# Patient Record
Sex: Female | Born: 1985 | Race: Black or African American | Hispanic: No | Smoking: Former smoker
Health system: Southern US, Community
[De-identification: ages and names within clinical notes are randomized; demographics above are authoritative.]

## PROBLEM LIST (undated history)

## (undated) DIAGNOSIS — N83209 Unspecified ovarian cyst, unspecified side: Secondary | ICD-10-CM

## (undated) DIAGNOSIS — Z720 Tobacco use: Secondary | ICD-10-CM

## (undated) DIAGNOSIS — D259 Leiomyoma of uterus, unspecified: Secondary | ICD-10-CM

## (undated) DIAGNOSIS — J84116 Cryptogenic organizing pneumonia: Secondary | ICD-10-CM

## (undated) DIAGNOSIS — J45909 Unspecified asthma, uncomplicated: Secondary | ICD-10-CM

## (undated) DIAGNOSIS — D649 Anemia, unspecified: Secondary | ICD-10-CM

## (undated) DIAGNOSIS — G43909 Migraine, unspecified, not intractable, without status migrainosus: Secondary | ICD-10-CM

## (undated) HISTORY — DX: Anemia, unspecified: D64.9

## (undated) HISTORY — PX: TUBAL LIGATION: SHX77

---
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
## 2010-07-04 ENCOUNTER — Emergency Department (HOSPITAL_COMMUNITY)
Admission: EM | Admit: 2010-07-04 | Discharge: 2010-07-05 | Disposition: A | Discharge status: Home / Self Care | Payer: Self-pay | Attending: Emergency Medicine | Admitting: Emergency Medicine

## 2010-07-04 DIAGNOSIS — R55 Syncope and collapse: Secondary | ICD-10-CM | POA: Insufficient documentation

## 2010-07-04 DIAGNOSIS — N898 Other specified noninflammatory disorders of vagina: Secondary | ICD-10-CM | POA: Insufficient documentation

## 2010-07-04 DIAGNOSIS — R10814 Left lower quadrant abdominal tenderness: Secondary | ICD-10-CM | POA: Insufficient documentation

## 2010-07-04 DIAGNOSIS — R51 Headache: Secondary | ICD-10-CM | POA: Insufficient documentation

## 2010-07-04 DIAGNOSIS — N739 Female pelvic inflammatory disease, unspecified: Secondary | ICD-10-CM | POA: Insufficient documentation

## 2010-07-04 DIAGNOSIS — H538 Other visual disturbances: Secondary | ICD-10-CM | POA: Insufficient documentation

## 2010-07-04 DIAGNOSIS — R1032 Left lower quadrant pain: Secondary | ICD-10-CM | POA: Insufficient documentation

## 2010-07-04 DIAGNOSIS — N949 Unspecified condition associated with female genital organs and menstrual cycle: Secondary | ICD-10-CM | POA: Insufficient documentation

## 2010-07-04 DIAGNOSIS — D72829 Elevated white blood cell count, unspecified: Secondary | ICD-10-CM | POA: Insufficient documentation

## 2010-07-04 DIAGNOSIS — R002 Palpitations: Secondary | ICD-10-CM | POA: Insufficient documentation

## 2010-07-04 DIAGNOSIS — D649 Anemia, unspecified: Secondary | ICD-10-CM | POA: Insufficient documentation

## 2010-07-04 LAB — BASIC METABOLIC PANEL
Calcium: 9.3 mg/dL (ref 8.4–10.5)
GFR calc Af Amer: >60 mL/min (ref >60)
GFR calc non Af Amer: >60 mL/min (ref >60)
Sodium: 135 mEq/L (ref 135–145)

## 2010-07-04 LAB — DIFFERENTIAL
Basophils Absolute: 0 10*3/uL (ref 0.0–0.1)
Lymphocytes Relative: 21 % (ref 12–46)
Lymphs Abs: 2.4 10*3/uL (ref 0.7–4.0)
Monocytes Absolute: 0.5 10*3/uL (ref 0.1–1.0)
Neutro Abs: 8 10*3/uL — ABNORMAL HIGH (ref 1.7–7.7)

## 2010-07-04 LAB — URINALYSIS, ROUTINE W REFLEX MICROSCOPIC
Nitrite: NEGATIVE
Protein, ur: NEGATIVE mg/dL
Urobilinogen, UA: 1 mg/dL (ref 0.0–1.0)

## 2010-07-04 LAB — CBC
HCT: 34.9 % — ABNORMAL LOW (ref 36.0–46.0)
Hemoglobin: 11.5 g/dL — ABNORMAL LOW (ref 12.0–15.0)
MCHC: 33 g/dL (ref 30.0–36.0)
MCV: 95.6 fL (ref 78.0–100.0)
WBC: 11.1 10*3/uL — ABNORMAL HIGH (ref 4.0–10.5)

## 2010-07-04 LAB — POCT CARDIAC MARKERS
CKMB, poc: <1 ng/mL — ABNORMAL LOW (ref 1.0–8.0)
Myoglobin, poc: 33 ng/mL (ref 12–200)

## 2010-07-04 LAB — URINE MICROSCOPIC-ADD ON

## 2010-07-05 ENCOUNTER — Emergency Department (HOSPITAL_COMMUNITY): Payer: Self-pay

## 2010-07-05 LAB — WET PREP, GENITAL

## 2010-07-07 LAB — GC/CHLAMYDIA PROBE AMP, GENITAL: GC Probe Amp, Genital: NEGATIVE

## 2011-11-14 ENCOUNTER — Emergency Department (HOSPITAL_COMMUNITY)
Admission: EM | Admit: 2011-11-14 | Discharge: 2011-11-14 | Disposition: A | Discharge status: Home / Self Care | Payer: Self-pay | Attending: Emergency Medicine | Admitting: Emergency Medicine

## 2011-11-14 ENCOUNTER — Encounter (HOSPITAL_COMMUNITY): Payer: Self-pay | Admitting: Emergency Medicine

## 2011-11-14 DIAGNOSIS — J329 Chronic sinusitis, unspecified: Secondary | ICD-10-CM | POA: Insufficient documentation

## 2011-11-14 DIAGNOSIS — J45909 Unspecified asthma, uncomplicated: Secondary | ICD-10-CM | POA: Insufficient documentation

## 2011-11-14 DIAGNOSIS — F17200 Nicotine dependence, unspecified, uncomplicated: Secondary | ICD-10-CM | POA: Insufficient documentation

## 2011-11-14 HISTORY — DX: Unspecified asthma, uncomplicated: J45.909

## 2011-11-14 HISTORY — DX: Migraine, unspecified, not intractable, without status migrainosus: G43.909

## 2011-11-14 MED ORDER — AMOXICILLIN 500 MG PO CAPS
1000.0000 mg | ORAL_CAPSULE | Freq: Once | ORAL | Status: AC
  Administered 2011-11-14: 1000 mg via ORAL
  Filled 2011-11-14: qty 2

## 2011-11-14 MED ORDER — OXYCODONE-ACETAMINOPHEN 5-325 MG PO TABS: 1.0000 | ORAL_TABLET | ORAL | Status: AC | PRN

## 2011-11-14 MED ORDER — AMOXICILLIN 500 MG PO CAPS: 1000.0000 mg | ORAL_CAPSULE | Freq: Two times a day (BID) | ORAL | Status: AC

## 2011-11-14 MED ORDER — HYDROCODONE-ACETAMINOPHEN 5-325 MG PO TABS
2.0000 | ORAL_TABLET | Freq: Once | ORAL | Status: AC
  Administered 2011-11-14: 2 via ORAL
  Filled 2011-11-14: qty 2

## 2011-11-14 MED ORDER — OXYMETAZOLINE HCL 0.05 % NA SOLN
1.0000 | Freq: Once | NASAL | Status: AC
  Administered 2011-11-14: 1 via NASAL
  Filled 2011-11-14: qty 15

## 2011-11-14 NOTE — ED Notes (Signed)
Reports has sinus infection---reports R side of face hurts and headache--reports to R ear, R teeth to top of R forehead pain; reports coughed up green phlegm today and having congestion

## 2011-11-14 NOTE — ED Provider Notes (Signed)
History   This chart was scribed for Hilario Quarry, MD by Melba Coon. The patient was seen in room TR08C/TR08C and the patient's care was started at 9:20PM.    CSN: [id number]  Arrival date & time 11/14/11  2011   First MD Initiated Contact with Patient 11/14/11 2113      Chief Complaint  Patient presents with  . Sinusitis    (Consider location/radiation/quality/duration/timing/severity/associated sxs/prior treatment) The history is provided by the patient. No language interpreter was used.   Madison Copeland is a 26 y.o. female who presents to the Emergency Department complaining of persistent, moderate to severe superior headache with associated productive hemoptysis mixed with green phlegm with an onset yesterday. Hx of sinus infections in the past. Pt states she is spitting up blood and feels like she has a fever. Ibuprofen and oral sinus decongestants did not alleviate the symptoms. Nosebleed with green mucous today. Neck pain present. No HA, fever, neck pain, sore throat, rash, back pain, CP, SOB, abd pain, n/v/d, dysuria, or extremity pain, edema, weakness, numbness, or tingling. Pt is not pregnant (tubal ligation). No known allergies. No other pertinent medical symptoms.  Past Medical History  Diagnosis Date  . Asthma   . Migraine     Past Surgical History  Procedure Date  . Cesarean section   Tubal ligation  History reviewed. No pertinent family history.  History  Substance Use Topics  . Smoking status: Current Everyday Smoker -- 0.2 packs/day  . Smokeless tobacco: Not on file  . Alcohol Use: Yes     occasion    OB History    Grav Para Term Preterm Abortions TAB SAB Ect Mult Living                  Review of Systems 10 Systems reviewed and all are negative for acute change except as noted in the HPI.   Allergies  Review of patient's allergies indicates no known allergies.  Home Medications   Current Outpatient Rx  Name Route Sig Dispense Refill  .  IBUPROFEN 200 MG PO TABS Oral Take 200 mg by mouth every 6 (six) hours as needed. For pain      BP 138/91  Pulse 74  Temp 98.6 F (37 C) (Oral)  Resp 14  SpO2 99%  Physical Exam  Nursing note and vitals reviewed. Constitutional: She is oriented to person, place, and time. She appears well-developed and well-nourished. No distress.  HENT:  Head: Normocephalic and atraumatic.       Maxillary sinus tenderness  Eyes: EOM are normal.  Neck: Neck supple. No tracheal deviation present.  Cardiovascular: Normal rate, regular rhythm and normal heart sounds.   No murmur heard. Pulmonary/Chest: Effort normal and breath sounds normal. No respiratory distress. She has no wheezes.  Musculoskeletal: Normal range of motion. She exhibits no edema and no tenderness.  Neurological: She is alert and oriented to person, place, and time.  Skin: Skin is warm and dry. No rash noted.  Psychiatric: She has a normal mood and affect. Her behavior is normal.    ED Course  Procedures (including critical care time)  DIAGNOSTIC STUDIES: Oxygen Saturation is 99% on room air, normal by my interpretation.    COORDINATION OF CARE:  9:23PM - amoxicillin and Afrin will be Rx for the pt. Pt ready for d/c.   Labs Reviewed - No data to display No results found.   No diagnosis found.    MDM  History/physical exam/procedure(s) were performed by  non-physician practitioner and as supervising physician I was immediately available for consultation/collaboration. I have reviewed all notes and am in agreement with care and plan.         Hilario Quarry, MD 11/14/11 2232

## 2012-07-04 ENCOUNTER — Emergency Department (HOSPITAL_COMMUNITY): Payer: Self-pay

## 2012-07-04 ENCOUNTER — Emergency Department (HOSPITAL_COMMUNITY)
Admission: EM | Admit: 2012-07-04 | Discharge: 2012-07-04 | Disposition: A | Discharge status: Home / Self Care | Payer: Self-pay | Attending: Emergency Medicine | Admitting: Emergency Medicine

## 2012-07-04 ENCOUNTER — Encounter (HOSPITAL_COMMUNITY): Payer: Self-pay | Admitting: *Deleted

## 2012-07-04 DIAGNOSIS — D259 Leiomyoma of uterus, unspecified: Secondary | ICD-10-CM

## 2012-07-04 DIAGNOSIS — J45909 Unspecified asthma, uncomplicated: Secondary | ICD-10-CM | POA: Insufficient documentation

## 2012-07-04 DIAGNOSIS — B9689 Other specified bacterial agents as the cause of diseases classified elsewhere: Secondary | ICD-10-CM

## 2012-07-04 DIAGNOSIS — N898 Other specified noninflammatory disorders of vagina: Secondary | ICD-10-CM | POA: Insufficient documentation

## 2012-07-04 DIAGNOSIS — Z3202 Encounter for pregnancy test, result negative: Secondary | ICD-10-CM | POA: Insufficient documentation

## 2012-07-04 DIAGNOSIS — R1032 Left lower quadrant pain: Secondary | ICD-10-CM

## 2012-07-04 DIAGNOSIS — Z8679 Personal history of other diseases of the circulatory system: Secondary | ICD-10-CM | POA: Insufficient documentation

## 2012-07-04 DIAGNOSIS — F17200 Nicotine dependence, unspecified, uncomplicated: Secondary | ICD-10-CM | POA: Insufficient documentation

## 2012-07-04 DIAGNOSIS — N760 Acute vaginitis: Secondary | ICD-10-CM | POA: Insufficient documentation

## 2012-07-04 DIAGNOSIS — A499 Bacterial infection, unspecified: Secondary | ICD-10-CM | POA: Insufficient documentation

## 2012-07-04 LAB — URINALYSIS, ROUTINE W REFLEX MICROSCOPIC
Hgb urine dipstick: NEGATIVE
Leukocytes, UA: NEGATIVE
Protein, ur: NEGATIVE mg/dL
Urobilinogen, UA: 1 mg/dL (ref 0.0–1.0)

## 2012-07-04 LAB — POCT PREGNANCY, URINE: Preg Test, Ur: NEGATIVE

## 2012-07-04 LAB — WET PREP, GENITAL: Trich, Wet Prep: NONE SEEN

## 2012-07-04 MED ORDER — HYDROCODONE-ACETAMINOPHEN 5-325 MG PO TABS
1.0000 | ORAL_TABLET | Freq: Once | ORAL | Status: AC
  Administered 2012-07-04: 1 via ORAL
  Filled 2012-07-04: qty 1

## 2012-07-04 MED ORDER — NAPROXEN 500 MG PO TABS: 500.0000 mg | ORAL_TABLET | Freq: Two times a day (BID) | ORAL | Status: DC

## 2012-07-04 MED ORDER — METRONIDAZOLE 500 MG PO TABS: 500.0000 mg | ORAL_TABLET | Freq: Two times a day (BID) | ORAL | Status: DC

## 2012-07-04 NOTE — ED Notes (Signed)
Patient returned from Ultrasound. 

## 2012-07-04 NOTE — ED Notes (Signed)
Pt reports sharp pelvic pain over last few days. Denies bleeding or d/c. L sided pelvic/lower abd pain. +nausea. Reports irregular periods for last few months.

## 2012-07-04 NOTE — ED Provider Notes (Signed)
History     CSN: [id number]  Arrival date & time 07/04/12  1030   First MD Initiated Contact with Patient 07/04/12 1035      Chief Complaint  Patient presents with  . Pelvic Pain    (Consider location/radiation/quality/duration/timing/severity/associated sxs/prior treatment) HPI Comments: 27 year old smoker presents emergency department complaining of left lower quadrant abdominal pain.  Onset of symptoms began in the last week and are described as a sharp stabbing sensation.  Pain radiates from left flank to groin area and back.  Patient reports that she suffers from menorrhagia typically and has had pain like this before, however she is not currently on her period. Patient's last menstrual period was 06/07/2012.  She is currently sexually active and does not use protection, patient states that she had her tubes tied.  She denies any nausea, vomiting, vaginal discharge, dysuria, hematuria, fevers, night sweats, chills, appetite change.  No other complaints at this time.    The history is provided by the patient.    Past Medical History  Diagnosis Date  . Asthma   . Migraine     Past Surgical History  Procedure Laterality Date  . Cesarean section      History reviewed. No pertinent family history.  History  Substance Use Topics  . Smoking status: Current Every Day Smoker -- 0.25 packs/day  . Smokeless tobacco: Never Used  . Alcohol Use: Yes     Comment: occasion    OB History   Grav Para Term Preterm Abortions TAB SAB Ect Mult Living                  Review of Systems  All other systems reviewed and are negative.    Allergies  Review of patient's allergies indicates no known allergies.  Home Medications   Current Outpatient Rx  Name  Route  Sig  Dispense  Refill  . ibuprofen (ADVIL,MOTRIN) 200 MG tablet   Oral   Take 200 mg by mouth every 6 (six) hours as needed. For pain           BP 130/79  Pulse 90  Temp(Src) 98.5 F (36.9 C) (Oral)  Resp 16   Wt 115 lb (52.164 kg)  SpO2 100%  LMP 06/07/2012  Physical Exam  Nursing note and vitals reviewed. Constitutional: She is oriented to person, place, and time. She appears well-developed and well-nourished. No distress.  HENT:  Head: Normocephalic and atraumatic.  Eyes: Conjunctivae and EOM are normal.  Neck: Normal range of motion.  Cardiovascular:  RRR, intact distal pulses   Pulmonary/Chest: Effort normal.  LCAB   Abdominal:  Abdomen soft, LLQ abdominal ttp. No peritoneal signs.   Genitourinary:  No CVA tenderness.  Exam performed by Jaci Carrel,  exam chaperoned Date: 07/04/2012 Pelvic exam: normal external genitalia without evidence of trauma. VAGINA: normal appearing vagina with normal color and discharge, no lesions. CERVIX: normal appearing cervix without lesions, cervical motion tenderness absent, cervical os closed with out purulent discharge; vaginal discharge - clear and white, Wet prep and DNA probe for chlamydia and GC obtained.  ADNEXA: left sided ttp, no masses    Musculoskeletal: Normal range of motion.  Neurological: She is alert and oriented to person, place, and time.  Skin: Skin is warm and dry. No rash noted. She is not diaphoretic.  Psychiatric: She has a normal mood and affect. Her behavior is normal.    ED Course  Procedures (including critical care time)  Labs Reviewed  WET PREP, GENITAL -  Abnormal; Notable for the following:    Clue Cells Wet Prep HPF POC MODERATE (*)    WBC, Wet Prep HPF POC FEW (*)    All other components within normal limits  GC/CHLAMYDIA PROBE AMP  URINALYSIS, ROUTINE W REFLEX MICROSCOPIC  POCT PREGNANCY, URINE   US Transvaginal Non-ob  07/04/2012  *RADIOLOGY REPORT*  Clinical Data: Pelvic pain  TRANSABDOMINAL ULTRASOUND OF PELVIS  Technique:  Transabdominal ultrasound examination of the pelvis was performed including evaluation of the uterus, ovaries, adnexal regions, and pelvic cul-de-sac.  Comparison:  07/05/2010   Findings:  Uterus:  10.2 x 4.2 x 5.6 cm.  C-section scar is evident.  The small sub centimeter anterior intramural fibroid noted.  Endometrium: 12 mm in double layer thickness.  Right ovary: 2 x 1.8 x 2.8 cm.  Sonographically normal.  Left ovary: 3.6 x 1.2 x 3.0 cm.  Sonographically normal.  Other Findings:  No free fluid  IMPRESSION: A small intramural anterior fibroid.  Otherwise normal.   Original Report Authenticated By: Kennith Center, M.D.    US Pelvis Complete  07/04/2012  *RADIOLOGY REPORT*  Clinical Data: Pelvic pain  TRANSABDOMINAL ULTRASOUND OF PELVIS  Technique:  Transabdominal ultrasound examination of the pelvis was performed including evaluation of the uterus, ovaries, adnexal regions, and pelvic cul-de-sac.  Comparison:  07/05/2010  Findings:  Uterus:  10.2 x 4.2 x 5.6 cm.  C-section scar is evident.  The small sub centimeter anterior intramural fibroid noted.  Endometrium: 12 mm in double layer thickness.  Right ovary: 2 x 1.8 x 2.8 cm.  Sonographically normal.  Left ovary: 3.6 x 1.2 x 3.0 cm.  Sonographically normal.  Other Findings:  No free fluid  IMPRESSION: A small intramural anterior fibroid.  Otherwise normal.   Original Report Authenticated By: Kennith Center, M.D.      No diagnosis found.     MDM  Patient to be discharged with instructions to follow up with OBGYN.  Pt understands that they have GC/Chlamydia cultures pending and that they will need to inform all sexual partners if results return positive. Pt not concerning for PID because hemodynamically stable and no cervical motion tenderness on pelvic exam. Pt has also been treated with flagyl for Bacterial Vaginosis. Pt has been advised to not drink alcohol while on this medication. Small fibroid seen [place].          Jaci Carrel, [place] 07/04/12 1449

## 2012-07-04 NOTE — ED Provider Notes (Signed)
Medical screening examination/treatment/procedure(s) were performed by non-physician practitioner and as supervising physician I was immediately available for consultation/collaboration.  Gilda Crease, MD 07/04/12 1455

## 2012-09-27 ENCOUNTER — Emergency Department (HOSPITAL_COMMUNITY): Payer: BC Managed Care – PPO

## 2012-09-27 ENCOUNTER — Encounter (HOSPITAL_COMMUNITY): Payer: Self-pay | Admitting: Emergency Medicine

## 2012-09-27 ENCOUNTER — Emergency Department (HOSPITAL_COMMUNITY)
Admission: EM | Admit: 2012-09-27 | Discharge: 2012-09-27 | Disposition: A | Discharge status: Home / Self Care | Payer: BC Managed Care – PPO | Attending: Emergency Medicine | Admitting: Emergency Medicine

## 2012-09-27 DIAGNOSIS — R110 Nausea: Secondary | ICD-10-CM | POA: Insufficient documentation

## 2012-09-27 DIAGNOSIS — Z8742 Personal history of other diseases of the female genital tract: Secondary | ICD-10-CM | POA: Insufficient documentation

## 2012-09-27 DIAGNOSIS — F17200 Nicotine dependence, unspecified, uncomplicated: Secondary | ICD-10-CM | POA: Insufficient documentation

## 2012-09-27 DIAGNOSIS — Z3202 Encounter for pregnancy test, result negative: Secondary | ICD-10-CM | POA: Insufficient documentation

## 2012-09-27 DIAGNOSIS — R1032 Left lower quadrant pain: Secondary | ICD-10-CM

## 2012-09-27 DIAGNOSIS — Z8679 Personal history of other diseases of the circulatory system: Secondary | ICD-10-CM | POA: Insufficient documentation

## 2012-09-27 DIAGNOSIS — Z79899 Other long term (current) drug therapy: Secondary | ICD-10-CM | POA: Insufficient documentation

## 2012-09-27 DIAGNOSIS — J45909 Unspecified asthma, uncomplicated: Secondary | ICD-10-CM | POA: Insufficient documentation

## 2012-09-27 DIAGNOSIS — N83209 Unspecified ovarian cyst, unspecified side: Secondary | ICD-10-CM | POA: Insufficient documentation

## 2012-09-27 DIAGNOSIS — N920 Excessive and frequent menstruation with regular cycle: Secondary | ICD-10-CM | POA: Insufficient documentation

## 2012-09-27 DIAGNOSIS — N390 Urinary tract infection, site not specified: Secondary | ICD-10-CM

## 2012-09-27 HISTORY — DX: Tobacco use: Z72.0

## 2012-09-27 HISTORY — DX: Leiomyoma of uterus, unspecified: D25.9

## 2012-09-27 LAB — URINALYSIS, ROUTINE W REFLEX MICROSCOPIC
Bilirubin Urine: NEGATIVE
Ketones, ur: NEGATIVE mg/dL
Nitrite: POSITIVE — AB
Specific Gravity, Urine: 1.026 (ref 1.005–1.030)
Urobilinogen, UA: 1 mg/dL (ref 0.0–1.0)

## 2012-09-27 LAB — CBC WITH DIFFERENTIAL/PLATELET
Eosinophils Absolute: 0.3 10*3/uL (ref 0.0–0.7)
Eosinophils Relative: 3 % (ref 0–5)
HCT: 36.5 % (ref 36.0–46.0)
Hemoglobin: 12.1 g/dL (ref 12.0–15.0)
Lymphs Abs: 1.4 10*3/uL (ref 0.7–4.0)
MCH: 31.6 pg (ref 26.0–34.0)
MCV: 95.3 fL (ref 78.0–100.0)
Monocytes Absolute: 0.4 10*3/uL (ref 0.1–1.0)
Monocytes Relative: 4 % (ref 3–12)
Neutrophils Relative %: 80 % — ABNORMAL HIGH (ref 43–77)
RBC: 3.83 MIL/uL — ABNORMAL LOW (ref 3.87–5.11)

## 2012-09-27 LAB — COMPREHENSIVE METABOLIC PANEL
ALT: 9 U/L (ref 0–35)
Alkaline Phosphatase: 48 U/L (ref 39–117)
BUN: 10 mg/dL (ref 6–23)
CO2: 24 mEq/L (ref 19–32)
Calcium: 9.5 mg/dL (ref 8.4–10.5)
GFR calc Af Amer: >90 mL/min (ref >90)
GFR calc non Af Amer: >90 mL/min (ref >90)
Glucose, Bld: 90 mg/dL (ref 70–99)
Sodium: 136 mEq/L (ref 135–145)

## 2012-09-27 LAB — WET PREP, GENITAL
Trich, Wet Prep: NONE SEEN
Yeast Wet Prep HPF POC: NONE SEEN

## 2012-09-27 MED ORDER — ONDANSETRON 4 MG PO TBDP: 4.0000 mg | ORAL_TABLET | Freq: Once | ORAL | Status: DC

## 2012-09-27 MED ORDER — ONDANSETRON 4 MG PO TBDP
4.0000 mg | ORAL_TABLET | Freq: Once | ORAL | Status: AC
  Administered 2012-09-27: 4 mg via ORAL
  Filled 2012-09-27: qty 1

## 2012-09-27 MED ORDER — MORPHINE SULFATE 4 MG/ML IJ SOLN
4.0000 mg | Freq: Once | INTRAMUSCULAR | Status: AC
  Administered 2012-09-27: 4 mg via INTRAVENOUS
  Filled 2012-09-27: qty 1

## 2012-09-27 MED ORDER — CLINDAMYCIN HCL 300 MG PO CAPS: 450.0000 mg | ORAL_CAPSULE | Freq: Once | ORAL | Status: DC

## 2012-09-27 MED ORDER — MORPHINE SULFATE 2 MG/ML IJ SOLN: 2.0000 mg | Freq: Once | INTRAMUSCULAR | Status: DC

## 2012-09-27 MED ORDER — IBUPROFEN 800 MG PO TABS: 800.0000 mg | ORAL_TABLET | Freq: Three times a day (TID) | ORAL | Status: DC | PRN

## 2012-09-27 MED ORDER — CEPHALEXIN 500 MG PO CAPS: 500.0000 mg | ORAL_CAPSULE | Freq: Three times a day (TID) | ORAL | Status: DC

## 2012-09-27 MED ORDER — MORPHINE SULFATE 2 MG/ML IJ SOLN
2.0000 mg | Freq: Once | INTRAMUSCULAR | Status: DC
  Filled 2012-09-27: qty 1

## 2012-09-27 NOTE — ED Notes (Signed)
Patient was educated not to drive, operate heavy machinery, or drink alcohol while taking narcotic medication.  

## 2012-09-27 NOTE — ED Notes (Signed)
MD at bedside. 

## 2012-09-27 NOTE — ED Provider Notes (Signed)
History    CSN: [id number] Arrival date & time 09/27/12  1436 First MD Initiated Contact with Patient 09/27/12 1515     Chief Complaint  Patient presents with  . Abdominal Pain   HPI  27 year old smoker with history of abdominal pain presents with an acute flare of intermittent 6 month abdominal pain.   Pain started 6 months ago. Not always associated with periods. Pain comes on twice a month and lasts for a few days. At times associated with nausea as it is today. No emesis.  Pain up to 10/10 at most severe. Located LLQ some radiation upwards to LUQ at times (slightly today). Pain up to 10/10 earlier today down to 6/10 with approximately. No constipation/diarrhea. Denies fever/chills/CVA tenderness. Mild suprapubic discomfort as well. LMP 09/24/12-currently on period and has menorrhagia.   Seen 07/04/12 and diagnosed with BV and found to have fibroid (sub cm intramural) on ultrasound. Seen in 2012 for abdominal pain as well (patient did not report pain that far back) and had small functional cyst.   Past Medical History  Diagnosis Date  . Asthma   . Migraine   . Fibroid, uterine     small intramural  . Tobacco abuse   advised to quit smoking  Past Surgical History  Procedure Laterality Date  . Cesarean section    . Tubal ligation     Family History  Problem Relation Age of Onset  . Diabetes Mother   . Heart disease Mother    History  Substance Use Topics  . Smoking status: Current Every Day Smoker -- 0.25 packs/day  . Smokeless tobacco: Never Used  . Alcohol Use: Yes     Comment: occasion   Review of Systems A full 10 point review of symptoms was performed and was negative except as noted in HPI.   Allergies  Review of patient's allergies indicates no known allergies.  Home Medications   Current Outpatient Rx  Name  Route  Sig  Dispense  Refill  . albuterol (PROVENTIL HFA;VENTOLIN HFA) 108 (90 BASE) MCG/ACT inhaler   Inhalation   Inhale 2 puffs into the lungs  every 6 (six) hours as needed for wheezing or shortness of breath.         Marland Kitchen ibuprofen (ADVIL,MOTRIN) 200 MG tablet   Oral   Take 800-1,200 mg by mouth every 6 (six) hours as needed for pain. For pain          BP 138/83  Pulse 76  Temp(Src) 98.8 F (37.1 C) (Oral)  Resp 16  SpO2 100% Physical Exam  Constitutional: She is oriented to person, place, and time. She appears well-developed and well-nourished. No distress.  HENT:  Head: Normocephalic and atraumatic.  Eyes: EOM are normal. Pupils are equal, round, and reactive to light.  Neck: Normal range of motion. Neck supple.  Cardiovascular: Normal rate and regular rhythm.  Exam reveals no gallop and no friction rub.   No murmur heard. Pulmonary/Chest: Effort normal and breath sounds normal. She has no wheezes. She has no rales.  Abdominal: Soft. Bowel sounds are normal. She exhibits no distension and no mass. There is tenderness (LLQ moderate). There is no rebound and no guarding.  Musculoskeletal: Normal range of motion. She exhibits no edema.  No CVA tenderness  Neurological: She is alert and oriented to person, place, and time. No cranial nerve deficit. She exhibits normal muscle tone. Coordination normal.  Skin: Skin is warm and dry.  Psychiatric: She has a normal  mood and affect. Her behavior is normal.  Physical Exam Pelvic exam: normal external genitalia, vulva, vagina, cervix, uterus and adnexa, VAGINA: normal appearing vagina with normal color and discharge, no lesions, CERVIX: normal appearing cervix without discharge or lesions, currently menstruating, UTERUS: uterus is normal size, shape, consistency and nontender, ADNEXA: normal adnexa in size, nontender and no masses, No CMT.   ED Course  Procedures (including critical care time) Labs Reviewed  WET PREP, GENITAL - Abnormal; Notable for the following:    Clue Cells Wet Prep HPF POC RARE (*)    WBC, Wet Prep HPF POC FEW (*)    All other components within normal  limits  CBC WITH DIFFERENTIAL - Abnormal; Notable for the following:    RBC 3.83 (*)    Neutrophils Relative % 80 (*)    Neutro Abs 8.2 (*)    All other components within normal limits  URINALYSIS, ROUTINE W REFLEX MICROSCOPIC - Abnormal; Notable for the following:    APPearance CLOUDY (*)    Hgb urine dipstick SMALL (*)    Nitrite POSITIVE (*)    Leukocytes, UA SMALL (*)    All other components within normal limits  URINE MICROSCOPIC-ADD ON - Abnormal; Notable for the following:    Squamous Epithelial / LPF MANY (*)    Bacteria, UA MANY (*)    All other components within normal limits  URINE CULTURE  GC/CHLAMYDIA PROBE AMP  LIPASE, BLOOD  COMPREHENSIVE METABOLIC PANEL  POCT PREGNANCY, URINE   Ct Abdomen Pelvis Wo Contrast  09/27/2012   *RADIOLOGY REPORT*  Clinical Data: Left flank and left lower quadrant abdominal pain  CT ABDOMEN AND PELVIS WITHOUT CONTRAST  Technique:  Multidetector CT imaging of the abdomen and pelvis was performed following the standard protocol without intravenous contrast.  Comparison: Pelvic ultrasound 07/04/2012  Findings:  Lower Chest:  Lung bases are clear.  Visualized heart structures within normal limits for size.  No pericardial effusion. Unremarkable distal thoracic esophagus.  Abdomen: Unenhanced CT was performed per clinician order.  Lack of IV contrast limits sensitivity and specificity, especially for evaluation of abdominal/pelvic solid viscera.  Unremarkable CT appearance of the stomach, duodenum, spleen, adrenal glands and pancreas.  Normal hepatic contours.  No focal hepatic lesion. Gallbladder is unremarkable. No intra or extrahepatic biliary ductal dilatation.  Symmetric renal size bilaterally.  No hydronephrosis no nephrolithiasis.  Normal-caliber large and small bowel throughout the abdomen.  No evidence of obstruction.  No significant colonic diverticular disease. Normal appendix.  Pelvis: There is a 3.4 cm low attenuation cystic structure arising  from the right adnexa.  Unremarkable bladder and uterus.  Tampon within the vagina.  Small amount of free fluid is likely physiologic. The left ovary is not well seen.  Bones: No acute fracture or aggressive appearing lytic or blastic osseous lesion.  Vascular: No significant vascular abnormality.  IMPRESSION:  1.  3.4 cm right ovarian cyst.  This is almost certainly benign, and no specific imaging follow up is recommended according to the Society of Radiologists in Ultrasound 2010 Consensus  Conference Statement (D Lenis Noon et al. Management of Asymptomatic Ovarian and Other Adnexal Cysts Imaged at [place]:  Society of Radiologists in Ultrasound Consensus Conference Statement 2010.  Radiology 256 (Sept 2010): 943-954.). 2.  Normal appendix. 2.  No nephro or ureterolithiasis.   Original Report Authenticated By: Malachy Moan, M.D.   1. UTI (lower urinary tract infection)   2. LLQ abdominal pain    MDM  Recurrent LLQ abdominal pain  x 6 months with acute flare. Flare similar to previous when seen in ED in April with ultrasound at that time showing small intramural fibroid only. CT abdomen to rule out nephrolithiasis and only finding was incidental right sided ovarian cyst which does nto require follow up. . Incidental ovarian cyst noted which is not likely cause of pain. UTI noted-will treat with Keflex (doubt this would cause level of pain). CBC, CMP, wet prep (rare clue with no reported discharge), lipase unremarkable. Upreg negative. No CMT so doubt PID. Gc/chlamydia pending.   Will send patient to OB/gyn as previously encouraged and GI for recurrent abdominal pain. Ibuprofen for pain.  Advised smoking cessation  Shelva Majestic, MD 09/27/12 2010

## 2012-09-27 NOTE — ED Provider Notes (Signed)
I saw and evaluated the patient, reviewed the resident's note and I agree with the findings and plan.  Patient does have a urinary tract infection.  CT scan without acute pathology.  Home with antibiotics  Lyanne Co, MD 09/27/12 [phone]

## 2012-09-27 NOTE — ED Notes (Signed)
States that she began having abd pain this am. States that she has not had vomiting or diarrhea with this pain. States that she abd pain periodically.

## 2012-09-28 LAB — GC/CHLAMYDIA PROBE AMP
CT Probe RNA: NEGATIVE
GC Probe RNA: NEGATIVE

## 2012-09-29 LAB — URINE CULTURE

## 2012-09-30 ENCOUNTER — Telehealth (HOSPITAL_COMMUNITY): Payer: Self-pay | Admitting: Emergency Medicine

## 2012-09-30 NOTE — ED Notes (Signed)
Post ED Visit - Positive Culture Follow-up  Culture report reviewed by antimicrobial stewardship pharmacist: []  Wes Dulaney, Pharm.D., BCPS []  Celedonio Miyamoto, Pharm.D., BCPS []  Georgina Pillion, Pharm.D., BCPS []  [place], [address].D., BCPS, AAHIVP []  Estella Husk, Pharm.D., BCPS, AAHIVP [x]  Okey Regal, [address].D., BCPS  Positive urine culture Treated with Keflex, organism sensitive to the same and no further patient follow-up is required at this time.  Kylie A Holland 09/30/2012, 6:06 PM

## 2012-10-01 ENCOUNTER — Encounter: Payer: Self-pay | Admitting: Gastroenterology

## 2012-10-03 ENCOUNTER — Ambulatory Visit (INDEPENDENT_AMBULATORY_CARE_PROVIDER_SITE_OTHER): Payer: BC Managed Care – PPO | Admitting: Obstetrics & Gynecology

## 2012-10-03 ENCOUNTER — Encounter: Payer: Self-pay | Admitting: Obstetrics & Gynecology

## 2012-10-03 VITALS — BP 140/92 | HR 62 | Temp 98.6°F | Ht 61.0 in | Wt 118.7 lb

## 2012-10-03 DIAGNOSIS — N946 Dysmenorrhea, unspecified: Secondary | ICD-10-CM

## 2012-10-03 MED ORDER — NORELGESTROMIN-ETH ESTRADIOL 150-35 MCG/24HR TD PTWK: 1.0000 | MEDICATED_PATCH | TRANSDERMAL | Status: DC

## 2012-10-03 NOTE — Patient Instructions (Signed)
Dysmenorrhea  Menstrual pain is caused by the muscles of the uterus tightening (contracting) during a menstrual period. The muscles of the uterus contract due to the chemicals in the uterine lining.  Primary dysmenorrhea is menstrual cramps that last a couple of days when you start having menstrual periods or soon after. This often begins after a teenager starts having her period. As a woman gets older or has a baby, the cramps will usually lesson or disappear.  Secondary dysmenorrhea begins later in life, lasts longer, and the pain may be stronger than primary dysmenorrhea. The pain may start before the period and last a few days after the period. This type of dysmenorrhea is usually caused by an underlying problem such as:   The tissue lining the uterus grows outside of the uterus in other areas of the body (endometriosis).   The endometrial tissue, which normally lines the uterus, is found in or grows into the muscular walls of the uterus (adenomyosis).   The pelvic blood vessels are engorged with blood just before the menstrual period (pelvic congestive syndrome).   Overgrowth of cells in the lining of the uterus or cervix (polyps of the uterus or cervix).   Falling down of the uterus (prolapse) because of loose or stretched ligaments.   Depression.   Bladder problems, infection, or inflammation.   Problems with the intestine, a tumor, or irritable bowel syndrome.   Cancer of the female organs or bladder.   A severely tipped uterus.   A very tight opening or closed cervix.   Noncancerous tumors of the uterus (fibroids).   Pelvic inflammatory disease (PID).   Pelvic scarring (adhesions) from a previous surgery.   Ovarian cyst.   An intrauterine device (IUD) used for birth control.  CAUSES   The cause of menstrual pain is often unknown.  SYMPTOMS    Cramping or throbbing pain in your lower abdomen.   Sometimes, a woman may also experience headaches.   Lower back pain.   Feeling sick to your  stomach (nausea) or vomiting.   Diarrhea.   Sweating or dizziness.  DIAGNOSIS   A diagnosis is based on your history, symptoms, physical examination, diagnostic tests, or procedures. Diagnostic tests or procedures may include:   Blood tests.   An ultrasound.   An examination of the lining of the uterus (dilation and curettage, D&C).   An examination inside your abdomen or pelvis with a scope (laparoscopy).   X-rays.   CT Scan.   MRI.   An examination inside the bladder with a scope (cystoscopy).   An examination inside the intestine or stomach with a scope (colonoscopy, gastroscopy).  TREATMENT   Treatment depends on the cause of the dysmenorrhea. Treatment may include:   Pain medicine prescribed by your caregiver.   Birth control pills.   Hormone replacement therapy.   Nonsteroidal anti-inflammatory drugs (NSAIDs). These may help stop the production of prostaglandins.   An IUD with progesterone hormone in it.   Acupuncture.   Surgery to remove adhesions, endometriosis, ovarian cyst, or fibroids.   Removal of the uterus (hysterectomy).   Progesterone shots to stop the menstrual period.   Cutting the nerves on the sacrum that go to the female organs (presacral neurectomy).   Electric currant to the sacral nerves (sacral nerve stimulation).   Antidepressant medicine.   Psychiatric therapy, counseling, or group therapy.   Exercise and physical therapy.   Meditation and yoga therapy.  HOME CARE INSTRUCTIONS    Only take over-the-counter   or prescription medicines for pain, discomfort, or fever as directed by your caregiver.   Place a heating pad or hot water bottle on your lower back or abdomen. Do not sleep with the heating pad.   Use aerobic exercises, walking, swimming, biking, and other exercises to help lessen the cramping.   Massage to the lower back or abdomen may help.   Stop smoking.   Avoid alcohol and caffeine.   Yoga, meditation, or acupuncture may help.  SEEK MEDICAL CARE IF:     The pain does not get better with medicine.   You have pain with sexual intercourse.  SEEK IMMEDIATE MEDICAL CARE IF:    Your pain increases and is not controlled with medicines.   You have a fever.   You develop nausea or vomiting with your period not controlled with medicine.   You have abnormal vaginal bleeding with your period.   You pass out.  MAKE SURE YOU:    Understand these instructions.   Will watch your condition.   Will get help right away if you are not doing well or get worse.  Document Released: 03/21/2005 Document Revised: 06/13/2011 Document Reviewed: 07/07/2008  [employer] Patient Information 2014 [employer]

## 2012-10-03 NOTE — Progress Notes (Signed)
Patient ID: Madison Copeland, female   DOB: Apr 18, 1985, 27 y.o.   MRN: [id number]  Chief Complaint  Patient presents with  . Referral    WLED for fibroids    HPI Madison Copeland is a 27 y.o. female.  [id number] Patient's last menstrual period was 09/27/2012. Heavy period, regular, last for 8 days, with pain. Had tubes tied 3 years ago.  HPI  Past Medical History  Diagnosis Date  . Asthma   . Migraine   . Fibroid, uterine     small intramural  . Tobacco abuse     Past Surgical History  Procedure Laterality Date  . Cesarean section    . Tubal ligation      Family History  Problem Relation Age of Onset  . Diabetes Mother   . Heart disease Mother     Social History History  Substance Use Topics  . Smoking status: Current Every Day Smoker -- 0.25 packs/day  . Smokeless tobacco: Never Used  . Alcohol Use: Yes     Comment: occasion    No Known Allergies  Current Outpatient Prescriptions  Medication Sig Dispense Refill  . albuterol (PROVENTIL HFA;VENTOLIN HFA) 108 (90 BASE) MCG/ACT inhaler Inhale 2 puffs into the lungs every 6 (six) hours as needed for wheezing or shortness of breath.      . cephALEXin (KEFLEX) 500 MG capsule Take 1 capsule (500 mg total) by mouth 3 (three) times daily. For UTI.  21 capsule  0  . ibuprofen (ADVIL,MOTRIN) 800 MG tablet Take 1 tablet (800 mg total) by mouth every 8 (eight) hours as needed for pain.  30 tablet  0  . norelgestromin-ethinyl estradiol (ORTHO EVRA) 150-35 MCG/24HR transdermal patch Place 1 patch onto the skin once a week.  3 patch  12  . ondansetron (ZOFRAN-ODT) 4 MG disintegrating tablet Take 1 tablet (4 mg total) by mouth once.  20 tablet  0   No current facility-administered medications for this visit.    Review of Systems Review of Systems  Blood pressure 140/92, pulse 62, temperature 98.6 F (37 C), temperature source Oral, height 5\' 1"  (1.549 m), weight 118 lb 11.2 oz (53.842 kg), last menstrual period 09/27/2012.  Physical  Exam Physical Exam  Data Reviewed *RADIOLOGY REPORT*  Clinical Data: Pelvic pain  TRANSABDOMINAL ULTRASOUND OF PELVIS  Technique: Transabdominal ultrasound examination of the pelvis was  performed including evaluation of the uterus, ovaries, adnexal  regions, and pelvic cul-de-sac.  Comparison: 07/05/2010  Findings:  Uterus: 10.2 x 4.2 x 5.6 cm. C-section scar is evident. The  small sub centimeter anterior intramural fibroid noted.  Endometrium: 12 mm in double layer thickness.  Right ovary: 2 x 1.8 x 2.8 cm. Sonographically normal.  Left ovary: 3.6 x 1.2 x 3.0 cm. Sonographically normal.  Other Findings: No free fluid  IMPRESSION:  A small intramural anterior fibroid. Otherwise normal.  Original Report Authenticated By: Kennith Center, M.D.    Assessment    Menorrhagia and dysmenorrhea   Miniscule fibroid H/O  Smoker-encouraged to quit Plan    Ortho Evra patch, RTC 3 months        Roswell Ndiaye 10/03/2012, 4:32 PM

## 2012-10-17 ENCOUNTER — Ambulatory Visit (INDEPENDENT_AMBULATORY_CARE_PROVIDER_SITE_OTHER): Payer: BC Managed Care – PPO | Admitting: Gastroenterology

## 2012-10-17 ENCOUNTER — Encounter: Payer: Self-pay | Admitting: Gastroenterology

## 2012-10-17 VITALS — BP 134/86 | HR 88 | Ht 61.0 in | Wt 117.1 lb

## 2012-10-17 DIAGNOSIS — K5900 Constipation, unspecified: Secondary | ICD-10-CM

## 2012-10-17 DIAGNOSIS — R109 Unspecified abdominal pain: Secondary | ICD-10-CM

## 2012-10-17 MED ORDER — HYOSCYAMINE SULFATE 0.125 MG SL SUBL: SUBLINGUAL_TABLET | SUBLINGUAL | Status: DC

## 2012-10-17 NOTE — Progress Notes (Signed)
History of Present Illness: This is a 27 year old female with a six-month history of recurrent left-sided abdominal pain occurring every few weeks. She has constipation and nausea. She has had episodes of vomiting and relates that her nausea and vomiting are related to certain smells and are associated with her pain. She notes frequent bloating as well. She generally has a bowel movement about every 3-4 days. She does not necessarily correlate with timing of bowel movements with any increase in other symptoms. She underwent abdominal/pelvic CT scan in late June showing a right ovarian cyst, and was otherwise unremarkable. Blood work performed in late June was unremarkable. Denies weight loss, diarrhea, change in stool caliber, melena, hematochezia, dysphagia, reflux symptoms, chest pain.  Review of Systems: Pertinent positive and negative review of systems were noted in the above HPI section. All other review of systems were otherwise negative.  Current Medications, Allergies, Past Medical History, Past Surgical History, Family History and Social History were reviewed in [employer] record.  Physical Exam: General: Well developed , well nourished, no acute distress Head: Normocephalic and atraumatic Eyes:  sclerae anicteric, EOMI Ears: Normal auditory acuity Mouth: No deformity or lesions Neck: Supple, no masses or thyromegaly Lungs: Clear throughout to auscultation Heart: Regular rate and rhythm; no murmurs, rubs or bruits Abdomen: Soft, mild generalized tenderness and non distended. No masses, hepatosplenomegaly or hernias noted. Normal Bowel sounds Musculoskeletal: Symmetrical with no gross deformities  Skin: No lesions on visible extremities Pulses:  Normal pulses noted Extremities: No clubbing, cyanosis, edema or deformities noted Neurological: Alert oriented x 4, grossly nonfocal Cervical Nodes:  No significant cervical adenopathy Inguinal Nodes: No significant  inguinal adenopathy Psychological:  Alert and cooperative. Normal mood and affect  Assessment and Recommendations:  1. Intermittent left-sided abdominal pain with constipation, bloating and intermittent nausea and vomiting. Presumed constipation related symptoms. Obtain stool Hemoccults. Begin MiraLax 1-3 times daily titrated for a bowel movement every day to every other day. Hyoscyamine when necessary. Return office visit in one month. Consider upper endoscopy and colonoscopy if symptoms are not substantially improved.

## 2012-10-17 NOTE — Patient Instructions (Addendum)
We have sent the following medications to your pharmacy for you to pick up at your convenience:  Take Miralax over the counter mixing 17 grams in at least 8 oz of water 1-2 x daily for constipation.  Follow instructions on Hemoccult cards and mail them back to [place] when finished.  Thank you for choosing me and [facility].  Venita Lick. Pleas Koch., MD., Clementeen Graham

## 2012-11-02 ENCOUNTER — Other Ambulatory Visit: Payer: Self-pay | Admitting: Gastroenterology

## 2012-11-02 DIAGNOSIS — K5900 Constipation, unspecified: Secondary | ICD-10-CM

## 2012-11-02 DIAGNOSIS — R109 Unspecified abdominal pain: Secondary | ICD-10-CM

## 2012-11-21 ENCOUNTER — Ambulatory Visit: Payer: BC Managed Care – PPO | Admitting: Gastroenterology

## 2012-12-10 ENCOUNTER — Encounter: Payer: Self-pay | Admitting: Gastroenterology

## 2012-12-10 ENCOUNTER — Ambulatory Visit (INDEPENDENT_AMBULATORY_CARE_PROVIDER_SITE_OTHER): Payer: BC Managed Care – PPO | Admitting: Gastroenterology

## 2012-12-10 VITALS — BP 132/94 | HR 84 | Ht 61.0 in | Wt 114.0 lb

## 2012-12-10 DIAGNOSIS — K5900 Constipation, unspecified: Secondary | ICD-10-CM

## 2012-12-10 DIAGNOSIS — R1032 Left lower quadrant pain: Secondary | ICD-10-CM

## 2012-12-10 MED ORDER — HYOSCYAMINE SULFATE ER 0.375 MG PO TB12: 0.3750 mg | ORAL_TABLET | Freq: Two times a day (BID) | ORAL | Status: DC | PRN

## 2012-12-10 NOTE — Progress Notes (Signed)
History of Present Illness: This is a 27 year old female who returns for followup of left lower quadrant abdominal pain and constipation. Her symptoms improved substantially with the use of hyoscyamine however she would like medication that lasts for more than 4 hours. She has not yet returned Hemoccults. MiraLax has improved her constipation.   Current Medications, Allergies, Past Medical History, Past Surgical History, Family History and Social History were reviewed in [employer] record.  Physical Exam: General: Well developed , well nourished, no acute distress Head: Normocephalic and atraumatic Eyes:  sclerae anicteric, EOMI Ears: Normal auditory acuity Mouth: No deformity or lesions Lungs: Clear throughout to auscultation Heart: Regular rate and rhythm; no murmurs, rubs or bruits Abdomen: Soft, mild left lower quadrant tenderness to deep palpation and non distended. No masses, hepatosplenomegaly or hernias noted. Normal Bowel sounds Musculoskeletal: Symmetrical with no gross deformities  Pulses:  Normal pulses noted Extremities: No clubbing, cyanosis, edema or deformities noted Neurological: Alert oriented x 4, grossly nonfocal Psychological:  Alert and cooperative. Normal mood and affect  Assessment and Recommendations:  1. Left lower cornerd pain and constipation. Continue MiraLax titrated for adequate bowel movements and a high fiber diet. Begin Levbid 1 by mouth twice a day and discontinue hyoscyamine. Return Hemoccult cards. Return office visit for 6 weeks.

## 2012-12-10 NOTE — Patient Instructions (Addendum)
You have been given a separate informational sheet regarding your tobacco use, the importance of quitting and local resources to help you quit.  Discontinue to Levsin.   We have sent the following medications to your pharmacy for you to pick up at your convenience: Levbid.  Please follow the instructions on Hemoccult cards and mail them back to [place] when finished.  Thank you for choosing me and [facility].  Venita Lick. Pleas Koch., MD., Clementeen Graham

## 2013-02-07 ENCOUNTER — Other Ambulatory Visit: Payer: Self-pay

## 2013-05-08 ENCOUNTER — Emergency Department (HOSPITAL_COMMUNITY)
Admission: EM | Admit: 2013-05-08 | Discharge: 2013-05-08 | Disposition: A | Discharge status: Home / Self Care | Payer: BC Managed Care – PPO | Attending: Emergency Medicine | Admitting: Emergency Medicine

## 2013-05-08 ENCOUNTER — Encounter (HOSPITAL_COMMUNITY): Payer: Self-pay | Admitting: Emergency Medicine

## 2013-05-08 DIAGNOSIS — Z8679 Personal history of other diseases of the circulatory system: Secondary | ICD-10-CM | POA: Insufficient documentation

## 2013-05-08 DIAGNOSIS — R110 Nausea: Secondary | ICD-10-CM | POA: Insufficient documentation

## 2013-05-08 DIAGNOSIS — Z8742 Personal history of other diseases of the female genital tract: Secondary | ICD-10-CM | POA: Insufficient documentation

## 2013-05-08 DIAGNOSIS — Z79899 Other long term (current) drug therapy: Secondary | ICD-10-CM | POA: Insufficient documentation

## 2013-05-08 DIAGNOSIS — Z862 Personal history of diseases of the blood and blood-forming organs and certain disorders involving the immune mechanism: Secondary | ICD-10-CM | POA: Insufficient documentation

## 2013-05-08 DIAGNOSIS — K029 Dental caries, unspecified: Secondary | ICD-10-CM

## 2013-05-08 DIAGNOSIS — F17200 Nicotine dependence, unspecified, uncomplicated: Secondary | ICD-10-CM | POA: Insufficient documentation

## 2013-05-08 DIAGNOSIS — J45909 Unspecified asthma, uncomplicated: Secondary | ICD-10-CM | POA: Insufficient documentation

## 2013-05-08 MED ORDER — TRAMADOL HCL 50 MG PO TABS: 50.0000 mg | ORAL_TABLET | Freq: Four times a day (QID) | ORAL | Status: DC | PRN

## 2013-05-08 MED ORDER — ONDANSETRON 4 MG PO TBDP
4.0000 mg | ORAL_TABLET | Freq: Once | ORAL | Status: AC
  Administered 2013-05-08: 4 mg via ORAL
  Filled 2013-05-08: qty 1

## 2013-05-08 MED ORDER — TRAMADOL HCL 50 MG PO TABS
50.0000 mg | ORAL_TABLET | Freq: Once | ORAL | Status: AC
  Administered 2013-05-08: 50 mg via ORAL
  Filled 2013-05-08: qty 1

## 2013-05-08 NOTE — ED Provider Notes (Signed)
CSN: [id number]     Arrival date & time 05/08/13  3710 History   First MD Initiated Contact with Patient 05/08/13 0356     Chief Complaint  Patient presents with  . Dental Pain   (Consider location/radiation/quality/duration/timing/severity/associated sxs/prior Treatment) HPI Comments: Patient states, for the past 2, days.  She's had a left lower jaw pain related to several dental care is does not have a dentist.  She's been taking ibuprofen, and using Anbesol without relief  Patient is a 28 y.o. female presenting with tooth pain. The history is provided by the patient.  Dental Pain Location:  Lower Lower teeth location:  19/LL 1st molar and 18/LL 2nd molar Quality:  Pulsating and constant Severity:  Severe Onset quality:  Gradual Duration:  2 days Timing:  Constant Progression:  Worsening Chronicity:  New Context: dental caries   Relieved by:  Nothing Worsened by:  Cold food/drink Ineffective treatments:  Acetaminophen and topical anesthetic gel Associated symptoms: no difficulty swallowing, no drooling, no facial pain, no facial swelling, no fever, no gum swelling, no headaches and no neck swelling     Past Medical History  Diagnosis Date  . Asthma   . Migraine   . Fibroid, uterine     small intramural  . Tobacco abuse   . Anemia     during pregnancy   Past Surgical History  Procedure Laterality Date  . Cesarean section      x 2  . Tubal ligation     Family History  Problem Relation Age of Onset  . Diabetes Mother   . Heart disease Mother   . Cervical cancer Maternal Aunt   . Kidney disease Maternal Grandmother    History  Substance Use Topics  . Smoking status: Current Every Day Smoker -- 0.25 packs/day for 15 years    Types: Cigarettes  . Smokeless tobacco: Never Used  . Alcohol Use: Yes     Comment: occasion   OB History   Grav Para Term Preterm Abortions TAB SAB Ect Mult Living   3    1  1   2      Review of Systems  Constitutional: Negative for  fever and chills.  HENT: Positive for dental problem. Negative for drooling and facial swelling.   Gastrointestinal: Positive for nausea.  Neurological: Negative for dizziness and headaches.  All other systems reviewed and are negative.    Allergies  Review of patient's allergies indicates no known allergies.  Home Medications   Current Outpatient Rx  Name  Route  Sig  Dispense  Refill  . albuterol (PROVENTIL HFA;VENTOLIN HFA) 108 (90 BASE) MCG/ACT inhaler   Inhalation   Inhale 2 puffs into the lungs every 6 (six) hours as needed for wheezing or shortness of breath.         Marland Kitchen ibuprofen (ADVIL,MOTRIN) 200 MG tablet   Oral   Take 800 mg by mouth every 6 (six) hours as needed for moderate pain.         . traMADol (ULTRAM) 50 MG tablet   Oral   Take 1 tablet (50 mg total) by mouth every 6 (six) hours as needed.   15 tablet   0    BP 145/87  Pulse 77  Temp(Src) 97.6 F (36.4 C) (Oral)  Resp 14  Ht 5\' 1"  (1.549 m)  Wt 113 lb (51.256 kg)  BMI 21.36 kg/m2  SpO2 100%  LMP 04/15/2013 Physical Exam  Nursing note and vitals reviewed. Constitutional: She appears well-developed  and well-nourished.  HENT:  Head: Normocephalic.  Right Ear: External ear normal.  Left Ear: External ear normal.  Mouth/Throat:    Cardiovascular: Normal rate and regular rhythm.   Musculoskeletal: Normal range of motion.  Lymphadenopathy:    She has no cervical adenopathy.  Neurological: She is alert.  Skin: Skin is warm and dry.    ED Course  Dental Date/Time: 05/08/2013 4:14 AM Performed by: Garald Balding Authorized by: Garald Balding Consent: Verbal consent obtained. written consent not obtained. Patient understanding: patient states understanding of the procedure being performed Patient identity confirmed: verbally with patient Local anesthesia used: yes Anesthesia: nerve block Local anesthetic: lidocaine 1% without epinephrine Patient sedated: no Patient tolerance: Patient  tolerated the procedure well with no immediate complications.   (including critical care time) Labs Review Labs Reviewed - No data to display Imaging Review No results found.  EKG Interpretation   None       MDM   1. Dental decay     Patient with or molars lower right, and lower left, with severe dental decay, no gum erythema or swelling.  Dental block was placed.  Left lower gum line with relief of pain.  She will be referred to dentist in the morning   Garald Balding, NP 05/08/13 [phone]

## 2013-05-08 NOTE — ED Notes (Signed)
Patient is alert and oriented x3.  She was given DC instructions and follow up visit instructions.  Patient gave verbal understanding. She was DC ambulatory under her own power to home.  V/S stable.  He was not showing any signs of distress on DC 

## 2013-05-08 NOTE — Discharge Instructions (Signed)
Dental Caries  Dental caries (also called tooth decay) is the most common oral disease. It can occur at any age, but is more common in children and young adults.  HOW DENTAL CARIES DEVELOPS  The process of decay begins when bacteria and foods (particularly sugars and starches) combine in your mouth to produce plaque. Plaque is a substance that sticks to the hard, outer surface of a tooth (enamel). The bacteria in plaque produce acids that attack enamel. These acids may also attack the root surface of a tooth (cementum) if it is exposed. Repeated attacks dissolve these surfaces and create holes in the tooth (cavities). If left untreated, the acids destroy the other layers of the tooth.  RISK FACTORS  Frequent sipping of sugary beverages.   Frequent snacking on sugary and starchy foods, especially those that easily get stuck in the teeth.   Poor oral hygiene.   Dry mouth.   Substance abuse such as methamphetamine abuse.   Broken or poor-fitting dental restorations.   Eating disorders.   Gastroesophageal reflux disease (GERD).   Certain radiation treatments to the head and neck. SYMPTOMS In the early stages of dental caries, symptoms are seldom present. Sometimes white, chalky areas may be seen on the enamel or other tooth layers. In later stages, symptoms may include:  Pits and holes on the enamel.  Toothache after sweet, hot, or cold foods or drinks are consumed.  Pain around the tooth.  Swelling around the tooth. DIAGNOSIS  Most of the time, dental caries is detected during a regular dental checkup. A diagnosis is made after a thorough medical and dental history is taken and the surfaces of your teeth are checked for signs of dental caries. Sometimes special instruments, such as lasers, are used to check for dental caries. Dental X-ray exams may be taken so that areas not visible to the eye (such as between the contact areas of the teeth) can be checked for cavities.    TREATMENT  If dental caries is in its early stages, it may be reversed with a fluoride treatment or an application of a remineralizing agent at the dental office. Thorough brushing and flossing at home is needed to aid these treatments. If it is in its later stages, treatment depends on the location and extent of tooth destruction:   If a small area of the tooth has been destroyed, the destroyed area will be removed and cavities will be filled with a material such as gold, silver amalgam, or composite resin.   If a large area of the tooth has been destroyed, the destroyed area will be removed and a cap (crown) will be fitted over the remaining tooth structure.   If the center part of the tooth (pulp) is affected, a procedure called a root canal will be needed before a filling or crown can be placed.   If most of the tooth has been destroyed, the tooth may need to be pulled (extracted). HOME CARE INSTRUCTIONS You can prevent, stop, or reverse dental caries at home by practicing good oral hygiene. Good oral hygiene includes:  Thoroughly cleaning your teeth at least twice a day with a toothbrush and dental floss.   Using a fluoride toothpaste. A fluoride mouth rinse may also be used if recommended by your dentist or health care provider.   Restricting the amount of sugary and starchy foods and sugary liquids you consume.   Avoiding frequent snacking on these foods and sipping of these liquids.   Keeping regular visits  with a dentist for checkups and cleanings. PREVENTION   Practice good oral hygiene.  Consider a dental sealant. A dental sealant is a coating material that is applied by your dentist to the pits and grooves of teeth. The sealant prevents food from being trapped in them. It may protect the teeth for several years.  Ask about fluoride supplements if you live in a community without fluorinated water or with water that has a low fluoride content. Use fluoride supplements  as directed by your dentist or health care provider.  Allow fluoride varnish applications to teeth if directed by your dentist or health care provider. Document Released: 12/11/2001 Document Revised: 11/21/2012 Document Reviewed: 03/23/2012 [employer] Patient Information 2014 [place]. While in the emergency Department, you were given a dental block, which has not pain, given a prescription for Ultram, chief and start taking when I nerve block starts wearing off.  He will get a tingling sensation in your jaw.  This is the time to start the pain medication, and take on a regular, basis.  You've also been referred to a dentist, please call first thing in the morning telling them that you were referred from the emergency department

## 2013-05-08 NOTE — ED Notes (Signed)
Pt reports dental pain to left lower jaw x 2 days; pt states that she has a broken tooth to the area and that it is causing her pain ; pt states that she has been taking ibuprofen at home with no improvement and is unable to sleep due to pain.

## 2013-05-12 NOTE — ED Provider Notes (Signed)
Medical screening examination/treatment/procedure(s) were performed by non-physician practitioner and as supervising physician I was immediately available for consultation/collaboration.  EKG Interpretation   None         Mirna Mires, MD 05/12/13 [phone]

## 2013-07-02 ENCOUNTER — Emergency Department (HOSPITAL_COMMUNITY)
Admission: EM | Admit: 2013-07-02 | Discharge: 2013-07-02 | Disposition: A | Discharge status: Home / Self Care | Payer: BC Managed Care – PPO | Attending: Emergency Medicine | Admitting: Emergency Medicine

## 2013-07-02 ENCOUNTER — Encounter (HOSPITAL_COMMUNITY): Payer: Self-pay | Admitting: Emergency Medicine

## 2013-07-02 DIAGNOSIS — R110 Nausea: Secondary | ICD-10-CM | POA: Insufficient documentation

## 2013-07-02 DIAGNOSIS — R51 Headache: Secondary | ICD-10-CM | POA: Insufficient documentation

## 2013-07-02 DIAGNOSIS — G8929 Other chronic pain: Secondary | ICD-10-CM | POA: Insufficient documentation

## 2013-07-02 DIAGNOSIS — Z8742 Personal history of other diseases of the female genital tract: Secondary | ICD-10-CM | POA: Insufficient documentation

## 2013-07-02 DIAGNOSIS — R1032 Left lower quadrant pain: Secondary | ICD-10-CM | POA: Insufficient documentation

## 2013-07-02 DIAGNOSIS — Z8679 Personal history of other diseases of the circulatory system: Secondary | ICD-10-CM | POA: Insufficient documentation

## 2013-07-02 DIAGNOSIS — Z862 Personal history of diseases of the blood and blood-forming organs and certain disorders involving the immune mechanism: Secondary | ICD-10-CM | POA: Insufficient documentation

## 2013-07-02 DIAGNOSIS — R52 Pain, unspecified: Secondary | ICD-10-CM | POA: Insufficient documentation

## 2013-07-02 DIAGNOSIS — F17200 Nicotine dependence, unspecified, uncomplicated: Secondary | ICD-10-CM | POA: Insufficient documentation

## 2013-07-02 DIAGNOSIS — J45909 Unspecified asthma, uncomplicated: Secondary | ICD-10-CM | POA: Insufficient documentation

## 2013-07-02 DIAGNOSIS — R109 Unspecified abdominal pain: Secondary | ICD-10-CM

## 2013-07-02 DIAGNOSIS — Z3202 Encounter for pregnancy test, result negative: Secondary | ICD-10-CM | POA: Insufficient documentation

## 2013-07-02 LAB — CBC WITH DIFFERENTIAL/PLATELET
BASOS ABS: 0 10*3/uL (ref 0.0–0.1)
BASOS PCT: 0 % (ref 0–1)
Eosinophils Absolute: 0.1 10*3/uL (ref 0.0–0.7)
Eosinophils Relative: 1 % (ref 0–5)
HEMATOCRIT: 38.5 % (ref 36.0–46.0)
Hemoglobin: 12.9 g/dL (ref 12.0–15.0)
Lymphocytes Relative: 9 % — ABNORMAL LOW (ref 12–46)
Lymphs Abs: 1.3 10*3/uL (ref 0.7–4.0)
MCH: 32.3 pg (ref 26.0–34.0)
MCHC: 33.5 g/dL (ref 30.0–36.0)
MCV: 96.5 fL (ref 78.0–100.0)
Monocytes Absolute: 0.5 10*3/uL (ref 0.1–1.0)
Monocytes Relative: 3 % (ref 3–12)
Neutro Abs: 12.5 10*3/uL — ABNORMAL HIGH (ref 1.7–7.7)
Neutrophils Relative %: 87 % — ABNORMAL HIGH (ref 43–77)
Platelets: 286 10*3/uL (ref 150–400)
RBC: 3.99 MIL/uL (ref 3.87–5.11)
RDW: 12.2 % (ref 11.5–15.5)
WBC: 14.4 10*3/uL — ABNORMAL HIGH (ref 4.0–10.5)

## 2013-07-02 LAB — URINALYSIS, ROUTINE W REFLEX MICROSCOPIC
Bilirubin Urine: NEGATIVE
Glucose, UA: NEGATIVE mg/dL
KETONES UR: NEGATIVE mg/dL
NITRITE: NEGATIVE
Protein, ur: NEGATIVE mg/dL
SPECIFIC GRAVITY, URINE: 1.023 (ref 1.005–1.030)
UROBILINOGEN UA: 1 mg/dL (ref 0.0–1.0)
pH: 5.5 (ref 5.0–8.0)

## 2013-07-02 LAB — URINE MICROSCOPIC-ADD ON

## 2013-07-02 LAB — COMPREHENSIVE METABOLIC PANEL
ALT: 15 U/L (ref 0–35)
AST: 22 U/L (ref 0–37)
Albumin: 4.5 g/dL (ref 3.5–5.2)
Alkaline Phosphatase: 53 U/L (ref 39–117)
BUN: 15 mg/dL (ref 6–23)
CALCIUM: 9.7 mg/dL (ref 8.4–10.5)
CO2: 23 mEq/L (ref 19–32)
Chloride: 102 mEq/L (ref 96–112)
Creatinine, Ser: 0.65 mg/dL (ref 0.50–1.10)
GFR calc Af Amer: >90 mL/min (ref >90)
Glucose, Bld: 103 mg/dL — ABNORMAL HIGH (ref 70–99)
Potassium: 4 mEq/L (ref 3.7–5.3)
Sodium: 138 mEq/L (ref 137–147)
Total Bilirubin: 0.8 mg/dL (ref 0.3–1.2)
Total Protein: 8.1 g/dL (ref 6.0–8.3)

## 2013-07-02 LAB — POC URINE PREG, ED: PREG TEST UR: NEGATIVE

## 2013-07-02 LAB — LIPASE, BLOOD: Lipase: 17 U/L (ref 11–59)

## 2013-07-02 MED ORDER — KETOROLAC TROMETHAMINE 30 MG/ML IJ SOLN
30.0000 mg | Freq: Once | INTRAMUSCULAR | Status: AC
  Administered 2013-07-02: 30 mg via INTRAVENOUS
  Filled 2013-07-02: qty 1

## 2013-07-02 MED ORDER — PROMETHAZINE HCL 25 MG/ML IJ SOLN
25.0000 mg | Freq: Once | INTRAMUSCULAR | Status: AC
  Administered 2013-07-02: 25 mg via INTRAVENOUS
  Filled 2013-07-02: qty 1

## 2013-07-02 MED ORDER — SODIUM CHLORIDE 0.9 % IV BOLUS (SEPSIS)
500.0000 mL | Freq: Once | INTRAVENOUS | Status: AC
  Administered 2013-07-02: 500 mL via INTRAVENOUS

## 2013-07-02 MED ORDER — HYOSCYAMINE SULFATE ER 0.375 MG PO TB12: 0.3750 mg | ORAL_TABLET | Freq: Two times a day (BID) | ORAL | Status: DC | PRN

## 2013-07-02 NOTE — Discharge Instructions (Signed)
Abdominal Pain, Women °Abdominal (stomach, pelvic, or belly) pain can be caused by many things. It is important to tell your doctor: °· The location of the pain. °· Does it come and go or is it present all the time? °· Are there things that start the pain (eating certain foods, exercise)? °· Are there other symptoms associated with the pain (fever, nausea, vomiting, diarrhea)? °All of this is helpful to know when trying to find the cause of the pain. °CAUSES  °· Stomach: virus or bacteria infection, or ulcer. °· Intestine: appendicitis (inflamed appendix), regional ileitis (Crohn's disease), ulcerative colitis (inflamed colon), irritable bowel syndrome, diverticulitis (inflamed diverticulum of the colon), or cancer of the stomach or intestine. °· Gallbladder disease or stones in the gallbladder. °· Kidney disease, kidney stones, or infection. °· Pancreas infection or cancer. °· Fibromyalgia (pain disorder). °· Diseases of the female organs: °· Uterus: fibroid (non-cancerous) tumors or infection. °· Fallopian tubes: infection or tubal pregnancy. °· Ovary: cysts or tumors. °· Pelvic adhesions (scar tissue). °· Endometriosis (uterus lining tissue growing in the pelvis and on the pelvic organs). °· Pelvic congestion syndrome (female organs filling up with blood just before the menstrual period). °· Pain with the menstrual period. °· Pain with ovulation (producing an egg). °· Pain with an IUD (intrauterine device, birth control) in the uterus. °· Cancer of the female organs. °· Functional pain (pain not caused by a disease, may improve without treatment). °· Psychological pain. °· Depression. °DIAGNOSIS  °Your doctor will decide the seriousness of your pain by doing an examination. °· Blood tests. °· X-rays. °· Ultrasound. °· CT scan (computed tomography, special type of X-ray). °· MRI (magnetic resonance imaging). °· Cultures, for infection. °· Barium enema (dye inserted in the large intestine, to better view it with  X-rays). °· Colonoscopy (looking in intestine with a lighted tube). °· Laparoscopy (minor surgery, looking in abdomen with a lighted tube). °· Major abdominal exploratory surgery (looking in abdomen with a large incision). °TREATMENT  °The treatment will depend on the cause of the pain.  °· Many cases can be observed and treated at home. °· Over-the-counter medicines recommended by your caregiver. °· Prescription medicine. °· Antibiotics, for infection. °· Birth control pills, for painful periods or for ovulation pain. °· Hormone treatment, for endometriosis. °· Nerve blocking injections. °· Physical therapy. °· Antidepressants. °· Counseling with a psychologist or psychiatrist. °· Minor or major surgery. °HOME CARE INSTRUCTIONS  °· Do not take laxatives, unless directed by your caregiver. °· Take over-the-counter pain medicine only if ordered by your caregiver. Do not take aspirin because it can cause an upset stomach or bleeding. °· Try a clear liquid diet (broth or water) as ordered by your caregiver. Slowly move to a bland diet, as tolerated, if the pain is related to the stomach or intestine. °· Have a thermometer and take your temperature several times a day, and record it. °· Bed rest and sleep, if it helps the pain. °· Avoid sexual intercourse, if it causes pain. °· Avoid stressful situations. °· Keep your follow-up appointments and tests, as your caregiver orders. °· If the pain does not go away with medicine or surgery, you may try: °· Acupuncture. °· Relaxation exercises (yoga, meditation). °· Group therapy. °· Counseling. °SEEK MEDICAL CARE IF:  °· You notice certain foods cause stomach pain. °· Your home care treatment is not helping your pain. °· You need stronger pain medicine. °· You want your IUD removed. °· You feel faint or   lightheaded. °· You develop nausea and vomiting. °· You develop a rash. °· You are having side effects or an allergy to your medicine. °SEEK IMMEDIATE MEDICAL CARE IF:  °· Your  pain does not go away or gets worse. °· You have a fever. °· Your pain is felt only in portions of the abdomen. The right side could possibly be appendicitis. The left lower portion of the abdomen could be colitis or diverticulitis. °· You are passing blood in your stools (bright red or black tarry stools, with or without vomiting). °· You have blood in your urine. °· You develop chills, with or without a fever. °· You pass out. °MAKE SURE YOU:  °· Understand these instructions. °· Will watch your condition. °· Will get help right away if you are not doing well or get worse. °Document Released: 01/16/2007 Document Revised: 06/13/2011 Document Reviewed: 02/05/2009 °[employer]® Patient Information ©2014 [employer]. ° °

## 2013-07-02 NOTE — Progress Notes (Signed)
[facility] provided pt with a list of primary care resources and a [facility] [employer] Card application to help patient establish primary care.  °

## 2013-07-02 NOTE — ED Notes (Signed)
Pt c/o LLQ pain x 1 wk.  Denies vomiting or diarrhea.  Denies dysuria.

## 2013-07-02 NOTE — ED Provider Notes (Signed)
CSN: [id number]     Arrival date & time 07/02/13  1259 History   First MD Initiated Contact with Patient 07/02/13 1435     Chief Complaint  Patient presents with  . Abdominal Pain     (Consider location/radiation/quality/duration/timing/severity/associated sxs/prior Treatment) Patient is a 28 y.o. female presenting with abdominal pain. The history is provided by the patient.  Abdominal Pain Pain location:  LLQ Associated symptoms: nausea   Associated symptoms: no chest pain, no diarrhea, no shortness of breath and no vomiting    patient with acute on chronic left lower abdominal pain. She's been having pain for the last year. She has seen gynecology and gastroenterology. He states he tried various treatments without success. She's had the pain this time for the last week and a half. She denies constipation. She denies diarrhea. She states she's had nausea without vomiting. No fevers. She states she's had decreased appetite. She states she's been having headaches also for years. He states sometimes the headaches will match up with abdominal pain, but not severely. She states that the pain does not match up with her menstrual cycle. She denies dysuria.  Past Medical History  Diagnosis Date  . Asthma   . Migraine   . Fibroid, uterine     small intramural  . Tobacco abuse   . Anemia     during pregnancy   Past Surgical History  Procedure Laterality Date  . Cesarean section      x 2  . Tubal ligation     Family History  Problem Relation Age of Onset  . Diabetes Mother   . Heart disease Mother   . Cervical cancer Maternal Aunt   . Kidney disease Maternal Grandmother    History  Substance Use Topics  . Smoking status: Current Every Day Smoker -- 0.25 packs/day for 15 years    Types: Cigarettes  . Smokeless tobacco: Never Used  . Alcohol Use: Yes     Comment: occasion   OB History   Grav Para Term Preterm Abortions TAB SAB Ect Mult Living   3    1  1   2      Review of  Systems  Constitutional: Negative for activity change and appetite change.  Eyes: Negative for pain.  Respiratory: Negative for chest tightness and shortness of breath.   Cardiovascular: Negative for chest pain and leg swelling.  Gastrointestinal: Positive for nausea and abdominal pain. Negative for vomiting and diarrhea.  Genitourinary: Negative for flank pain.  Musculoskeletal: Negative for back pain and neck stiffness.  Skin: Negative for rash.  Neurological: Positive for headaches. Negative for weakness and numbness.  Psychiatric/Behavioral: Negative for behavioral problems.      Allergies  Review of patient's allergies indicates no known allergies.  Home Medications   Current Outpatient Rx  Name  Route  Sig  Dispense  Refill  . ibuprofen (ADVIL,MOTRIN) 200 MG tablet   Oral   Take 800 mg by mouth every 6 (six) hours as needed for moderate pain.         . traMADol (ULTRAM) 50 MG tablet   Oral   Take 1 tablet (50 mg total) by mouth every 6 (six) hours as needed.   15 tablet   0   . hyoscyamine (LEVBID) 0.375 MG 12 hr tablet   Oral   Take 1 tablet (0.375 mg total) by mouth every 12 (twelve) hours as needed.   30 tablet   0    BP 123/77  Pulse  78  Temp(Src) 98 F (36.7 C) (Oral)  Resp 18  SpO2 100%  LMP 07/01/2013 Physical Exam  Nursing note and vitals reviewed. Constitutional: She is oriented to person, place, and time. She appears well-developed and well-nourished.  HENT:  Head: Normocephalic and atraumatic.  Eyes: EOM are normal. Pupils are equal, round, and reactive to light.  Neck: Normal range of motion. Neck supple.  Cardiovascular: Normal rate, regular rhythm and normal heart sounds.   No murmur heard. Pulmonary/Chest: Effort normal and breath sounds normal. No respiratory distress. She has no wheezes. She has no rales.  Abdominal: Soft. Bowel sounds are normal. She exhibits no distension. There is tenderness. There is no rebound and no guarding.   Left lower quadrant tenderness without rebound or guarding.  Musculoskeletal: Normal range of motion.  Neurological: She is alert and oriented to person, place, and time. No cranial nerve deficit.  Skin: Skin is warm and dry.  Psychiatric: She has a normal mood and affect. Her speech is normal.    ED Course  Procedures (including critical care time) Labs Review Labs Reviewed  CBC WITH DIFFERENTIAL - Abnormal; Notable for the following:    WBC 14.4 (*)    Neutrophils Relative % 87 (*)    Neutro Abs 12.5 (*)    Lymphocytes Relative 9 (*)    All other components within normal limits  COMPREHENSIVE METABOLIC PANEL - Abnormal; Notable for the following:    Glucose, Bld 103 (*)    All other components within normal limits  URINALYSIS, ROUTINE W REFLEX MICROSCOPIC - Abnormal; Notable for the following:    APPearance CLOUDY (*)    Hgb urine dipstick LARGE (*)    Leukocytes, UA SMALL (*)    All other components within normal limits  URINE MICROSCOPIC-ADD ON - Abnormal; Notable for the following:    Squamous Epithelial / LPF FEW (*)    Bacteria, UA MANY (*)    All other components within normal limits  URINE CULTURE  LIPASE, BLOOD  POC URINE PREG, ED   Imaging Review No results found.   EKG Interpretation None      MDM   Final diagnoses:  Abdominal pain    Patient with acute on chronic abdominal pain. Has been evaluated in the past. Will start her back on the knee and this has not she had been on from gastroenterology. Urinalysis has showed chronic white cell and bacteria elevation. Urine culture sent. Has had gynecologic workup also. Has had previous CAT scan. Patient feels somewhat better after treatment will be discharged home    Jasper Riling. Alvino Chapel, MD 07/03/13 [phone]

## 2013-07-03 LAB — URINE CULTURE
COLONY COUNT: NO GROWTH
Culture: NO GROWTH

## 2013-07-14 ENCOUNTER — Emergency Department (HOSPITAL_COMMUNITY)
Admission: EM | Admit: 2013-07-14 | Discharge: 2013-07-14 | Disposition: A | Discharge status: Home / Self Care | Payer: Medicaid Other | Attending: Emergency Medicine | Admitting: Emergency Medicine

## 2013-07-14 ENCOUNTER — Encounter (HOSPITAL_COMMUNITY): Payer: Self-pay | Admitting: Emergency Medicine

## 2013-07-14 DIAGNOSIS — L03317 Cellulitis of buttock: Principal | ICD-10-CM

## 2013-07-14 DIAGNOSIS — F17200 Nicotine dependence, unspecified, uncomplicated: Secondary | ICD-10-CM | POA: Insufficient documentation

## 2013-07-14 DIAGNOSIS — R509 Fever, unspecified: Secondary | ICD-10-CM | POA: Insufficient documentation

## 2013-07-14 DIAGNOSIS — L0231 Cutaneous abscess of buttock: Secondary | ICD-10-CM | POA: Insufficient documentation

## 2013-07-14 DIAGNOSIS — Z8679 Personal history of other diseases of the circulatory system: Secondary | ICD-10-CM | POA: Insufficient documentation

## 2013-07-14 DIAGNOSIS — Z862 Personal history of diseases of the blood and blood-forming organs and certain disorders involving the immune mechanism: Secondary | ICD-10-CM | POA: Insufficient documentation

## 2013-07-14 DIAGNOSIS — Z8742 Personal history of other diseases of the female genital tract: Secondary | ICD-10-CM | POA: Insufficient documentation

## 2013-07-14 DIAGNOSIS — J45909 Unspecified asthma, uncomplicated: Secondary | ICD-10-CM | POA: Insufficient documentation

## 2013-07-14 MED ORDER — HYDROCODONE-ACETAMINOPHEN 5-325 MG PO TABS
2.0000 | ORAL_TABLET | Freq: Once | ORAL | Status: AC
  Administered 2013-07-14: 2 via ORAL
  Filled 2013-07-14: qty 2

## 2013-07-14 MED ORDER — SULFAMETHOXAZOLE-TMP DS 800-160 MG PO TABS
1.0000 | ORAL_TABLET | Freq: Once | ORAL | Status: AC
  Administered 2013-07-14: 1 via ORAL
  Filled 2013-07-14: qty 1

## 2013-07-14 MED ORDER — CEPHALEXIN 500 MG PO CAPS
500.0000 mg | ORAL_CAPSULE | ORAL | Status: AC
  Administered 2013-07-14: 500 mg via ORAL
  Filled 2013-07-14: qty 1

## 2013-07-14 MED ORDER — SULFAMETHOXAZOLE-TRIMETHOPRIM 800-160 MG PO TABS: 1.0000 | ORAL_TABLET | Freq: Two times a day (BID) | ORAL | Status: AC

## 2013-07-14 MED ORDER — HYDROCODONE-ACETAMINOPHEN 5-325 MG PO TABS: 1.0000 | ORAL_TABLET | Freq: Four times a day (QID) | ORAL | Status: DC | PRN

## 2013-07-14 MED ORDER — CEPHALEXIN 500 MG PO CAPS: 500.0000 mg | ORAL_CAPSULE | Freq: Four times a day (QID) | ORAL | Status: DC

## 2013-07-14 MED ORDER — CEPHALEXIN 250 MG PO CAPS
250.0000 mg | ORAL_CAPSULE | Freq: Once | ORAL | Status: DC
  Filled 2013-07-14: qty 1

## 2013-07-14 NOTE — ED Provider Notes (Signed)
CSN: [id number]     Arrival date & time 07/14/13  1832 History  This chart was scribed for non-physician practitioner, Antonietta Breach, PA-C, working with Dot Lanes, MD by Ladene Artist, ED Scribe. This patient was seen in room [facility] and the patient's care was started at 8:15 PM.   Chief Complaint  Patient presents with  . Skin Abcess     Patient is a 28 y.o. female presenting with abscess. The history is provided by the patient. No language interpreter was used.  Abscess Location:  Ano-genital Red streaking: no   Duration:  2 weeks Progression:  Worsening Chronicity:  Recurrent Ineffective treatments:  Oral antibiotics Associated symptoms: fever   Associated symptoms: no vomiting    HPI Comments: Madison Copeland is a 28 y.o. female who presents to the Emergency Department complaining of intermittent, gradually worsening abscess on the top of her buttocks 2 weeks ago. She has a history of the same. Pt states that she has taken leftover antibiotics, Cephalexin, with no improvement. She also reports an associated fever, over 100.4 F, onset last night. ED temperature 99.7 F. Pt has taken ibuprofen every 4 hours with mild relief of pain. She states that the pain is worse with sitting for longer than 15 minutes and standing up. She denies vomiting, abdominal pain, pain with defecation, urinary symptoms, vaginal complaints, pelvic pain, diaphoresis, numbness/tingling, and weakness. She also denies red lines or streaking from abscess site.  Past Medical History  Diagnosis Date  . Asthma   . Migraine   . Fibroid, uterine     small intramural  . Tobacco abuse   . Anemia     during pregnancy   Past Surgical History  Procedure Laterality Date  . Cesarean section      x 2  . Tubal ligation     Family History  Problem Relation Age of Onset  . Diabetes Mother   . Heart disease Mother   . Cervical cancer Maternal Aunt   . Kidney disease Maternal Grandmother    History  Substance Use  Topics  . Smoking status: Current Every Day Smoker -- 0.25 packs/day for 15 years    Types: Cigarettes  . Smokeless tobacco: Never Used  . Alcohol Use: Yes     Comment: occasion   OB History   Grav Para Term Preterm Abortions TAB SAB Ect Mult Living   3    1  1   2      Review of Systems  Constitutional: Positive for fever.  Gastrointestinal: Negative for vomiting and abdominal pain.  Genitourinary: Negative for dysuria and pelvic pain.  Skin:       abscess  All other systems reviewed and are negative.   Allergies  Review of patient's allergies indicates no known allergies.  Home Medications   Current Outpatient Rx  Name  Route  Sig  Dispense  Refill  . ibuprofen (ADVIL,MOTRIN) 200 MG tablet   Oral   Take 800 mg by mouth every 6 (six) hours as needed for moderate pain.         . cephALEXin (KEFLEX) 500 MG capsule   Oral   Take 1 capsule (500 mg total) by mouth 4 (four) times daily.   40 capsule   0   . HYDROcodone-acetaminophen (NORCO/VICODIN) 5-325 MG per tablet   Oral   Take 1-2 tablets by mouth every 6 (six) hours as needed.   17 tablet   0   . sulfamethoxazole-trimethoprim (BACTRIM DS,SEPTRA DS) 800-160 MG  per tablet   Oral   Take 1 tablet by mouth 2 (two) times daily.   14 tablet   0    Triage Vitals: BP 123/71  Pulse 85  Temp(Src) 99.7 F (37.6 C) (Oral)  Resp 20  SpO2 100%  LMP 07/01/2013  Physical Exam  Nursing note and vitals reviewed. Constitutional: She is oriented to person, place, and time. She appears well-developed and well-nourished. No distress.  HENT:  Head: Normocephalic and atraumatic.  Eyes: Conjunctivae and EOM are normal. No scleral icterus.  Neck: Normal range of motion.  Pulmonary/Chest: Effort normal. No respiratory distress.  Genitourinary:     +Induration to L gluteal cleft with associated TTP. Area approximately 3cm x 5cm. No TTP or induration to the perirectal area or perineum. No red linear streaking.   Musculoskeletal: Normal range of motion.  Neurological: She is alert and oriented to person, place, and time.  No gross sensory deficits appreciated.  Skin: Skin is warm and dry. No rash noted. She is not diaphoretic. No erythema. No pallor.  Psychiatric: She has a normal mood and affect. Her behavior is normal.    ED Course  Procedures (including critical care time) DIAGNOSTIC STUDIES: Oxygen Saturation is 100% on RA, normal by my interpretation.    COORDINATION OF CARE: 8:22 PM-Discussed treatment plan which includes I&D with pt at bedside and pt agreed to plan.   Labs Review Labs Reviewed - No data to display Imaging Review No results found.   EKG Interpretation None     INCISION AND DRAINAGE Performed by: Antonietta Breach Consent: Verbal consent obtained. Risks and benefits: risks, benefits and alternatives were discussed Type: abscess  Body area: L gluteal cleft  Anesthesia: local infiltration  Incision was made with a scalpel.  Local anesthetic: lidocaine 1% with epinephrine  Anesthetic total: 10 ml  Complexity: complex Blunt dissection to break up loculations  Drainage: purulent  Drainage amount: copious  Packing material: 1/4 in iodoform gauze  Patient tolerance: Patient tolerated the procedure well with no immediate complications.    MDM   Final diagnoses:  Abscess, gluteal cleft    Patient with abscess to L gluteal cleft x 2 weeks amenable to incision and drainage. No induration or TTP of the perianal or perineal regions. Abscess packed and patient given instruction to return for packing removal and recheck in 2 days. Encouraged home warm soaks and compresses. Will d/c to home with Bactrim and Keflex. Norco prescribed for pain control. Patient afebrile, hemodynamically stable, and appropriate for d/c with necessary return precautions. General surgery referral provided. Patient agreeable to plan with no unaddressed concerns.  I personally performed  the services described in this documentation, which was scribed in my presence. The recorded information has been reviewed and is accurate.   Filed Vitals:   07/14/13 1900 07/14/13 2135  BP: 123/71 120/75  Pulse: 85 80  Temp: 99.7 F (37.6 C)   TempSrc: Oral   Resp: 20 20  SpO2: 100% 100%     Antonietta Breach, PA-C 07/14/13 2229

## 2013-07-14 NOTE — Discharge Instructions (Signed)
Take your antibiotics as prescribed. Take Norco as needed for severe pain. Recommend topical warm compresses 4 times per day. Return for a recheck and packing removal in 48 hours to either the ED or with general surgery. Return sooner if symptoms worsen.  Abscess Care After An abscess (also called a boil or furuncle) is an infected area that contains a collection of pus. Signs and symptoms of an abscess include pain, tenderness, redness, or hardness, or you may feel a moveable soft area under your skin. An abscess can occur anywhere in the body. The infection may spread to surrounding tissues causing cellulitis. A cut (incision) by the surgeon was made over your abscess and the pus was drained out. Gauze may have been packed into the space to provide a drain that will allow the cavity to heal from the inside outwards. The boil may be painful for 5 to 7 days. Most people with a boil do not have high fevers. Your abscess, if seen early, may not have localized, and may not have been lanced. If not, another appointment may be required for this if it does not get better on its own or with medications. HOME CARE INSTRUCTIONS   Only take over-the-counter or prescription medicines for pain, discomfort, or fever as directed by your caregiver.  When you bathe, soak and then remove gauze or iodoform packs at least daily or as directed by your caregiver. You may then wash the wound gently with mild soapy water. Repack with gauze or do as your caregiver directs. SEEK IMMEDIATE MEDICAL CARE IF:   You develop increased pain, swelling, redness, drainage, or bleeding in the wound site.  You develop signs of generalized infection including muscle aches, chills, fever, or a general ill feeling.  An oral temperature above 102 F (38.9 C) develops, not controlled by medication. See your caregiver for a recheck if you develop any of the symptoms described above. If medications (antibiotics) were prescribed, take them as  directed. Document Released: 10/07/2004 Document Revised: 06/13/2011 Document Reviewed: 06/04/2007 [facility] Patient Information 2014 [place].

## 2013-07-14 NOTE — ED Notes (Signed)
Pt states cyst/abcess on the top of her buttocks. Hx of same. States she had antibiotics at home that she's been taking this week w/ no improvement. Alert and oriented.

## 2013-07-16 ENCOUNTER — Encounter (HOSPITAL_COMMUNITY): Payer: Self-pay | Admitting: Emergency Medicine

## 2013-07-16 ENCOUNTER — Emergency Department (HOSPITAL_COMMUNITY)
Admission: EM | Admit: 2013-07-16 | Discharge: 2013-07-16 | Disposition: A | Discharge status: Home / Self Care | Payer: Medicaid Other | Attending: Emergency Medicine | Admitting: Emergency Medicine

## 2013-07-16 DIAGNOSIS — Z792 Long term (current) use of antibiotics: Secondary | ICD-10-CM | POA: Insufficient documentation

## 2013-07-16 DIAGNOSIS — F17200 Nicotine dependence, unspecified, uncomplicated: Secondary | ICD-10-CM | POA: Insufficient documentation

## 2013-07-16 DIAGNOSIS — Z8742 Personal history of other diseases of the female genital tract: Secondary | ICD-10-CM | POA: Insufficient documentation

## 2013-07-16 DIAGNOSIS — L0501 Pilonidal cyst with abscess: Secondary | ICD-10-CM | POA: Insufficient documentation

## 2013-07-16 DIAGNOSIS — Z4800 Encounter for change or removal of nonsurgical wound dressing: Secondary | ICD-10-CM

## 2013-07-16 DIAGNOSIS — J45909 Unspecified asthma, uncomplicated: Secondary | ICD-10-CM | POA: Insufficient documentation

## 2013-07-16 DIAGNOSIS — Z8679 Personal history of other diseases of the circulatory system: Secondary | ICD-10-CM | POA: Insufficient documentation

## 2013-07-16 NOTE — ED Provider Notes (Signed)
CSN: [id number]     Arrival date & time 07/16/13  1216 History  This chart was scribed for non-physician practitioner Jeannett Senior, PA-C working with Orpah Greek, MD by Zettie Pho, ED Scribe. This patient was seen in room WTR6/WTR6 and the patient's care was started at 1:38 PM.    No chief complaint on file.  The history is provided by the patient. No language interpreter was used.   HPI Comments: Madison Copeland is a 28 y.o. female who presents to the Emergency Department requesting a recheck of a wound to the top of the gluteal cleft that she sustained 2 days ago by having an abscess in the area incised and drained. Patient was discharged was Bactrim, Keflex, and Vicodin to manage symptoms, which she states she is still currently taking. Patient states that she has still been applying warm compresses to the area with some relief. She states that the area appears to be healing well with only a mild amount of persistent pain to the area with some associated drainage. Patient has a history of asthma.   Past Medical History  Diagnosis Date  . Asthma   . Migraine   . Fibroid, uterine     small intramural  . Tobacco abuse   . Anemia     during pregnancy   Past Surgical History  Procedure Laterality Date  . Cesarean section      x 2  . Tubal ligation     Family History  Problem Relation Age of Onset  . Diabetes Mother   . Heart disease Mother   . Cervical cancer Maternal Aunt   . Kidney disease Maternal Grandmother    History  Substance Use Topics  . Smoking status: Current Every Day Smoker -- 0.25 packs/day for 15 years    Types: Cigarettes  . Smokeless tobacco: Never Used  . Alcohol Use: Yes     Comment: occasion   OB History   Grav Para Term Preterm Abortions TAB SAB Ect Mult Living   3    1  1   2      Review of Systems  Skin: Positive for wound.   Allergies  Review of patient's allergies indicates no known allergies.  Home Medications   Prior to  Admission medications   Medication Sig Start Date End Date Taking? Authorizing Provider  cephALEXin (KEFLEX) 500 MG capsule Take 1 capsule (500 mg total) by mouth 4 (four) times daily. 07/14/13   Antonietta Breach, PA-C  HYDROcodone-acetaminophen (NORCO/VICODIN) 5-325 MG per tablet Take 1-2 tablets by mouth every 6 (six) hours as needed. 07/14/13   Antonietta Breach, PA-C  ibuprofen (ADVIL,MOTRIN) 200 MG tablet Take 800 mg by mouth every 6 (six) hours as needed for moderate pain.    Historical Provider, MD  sulfamethoxazole-trimethoprim (BACTRIM DS,SEPTRA DS) 800-160 MG per tablet Take 1 tablet by mouth 2 (two) times daily. 07/14/13 07/21/13  Antonietta Breach, PA-C   Triage Vitals: BP 124/88  Pulse 90  Temp(Src) 98.9 F (37.2 C) (Oral)  Resp 16  SpO2 100%  LMP 07/01/2013  Physical Exam  Nursing note and vitals reviewed. Constitutional: She is oriented to person, place, and time. She appears well-developed and well-nourished. No distress.  HENT:  Head: Normocephalic and atraumatic.  Eyes: Conjunctivae are normal.  Neck: Normal range of motion. Neck supple.  Pulmonary/Chest: Effort normal. No respiratory distress.  Abdominal: She exhibits no distension.  Musculoskeletal: Normal range of motion.  Neurological: She is alert and oriented to person, place, and  time.  Skin: Skin is warm and dry.  Chaperone (scribe) was present for exam which was performed with no discomfort or complications.   Pilonidal abscess with packing to the left, upper gluteal cleft. No surrounding erythema or induration. Tender to palpation. Mild purulent drainage.   Psychiatric: She has a normal mood and affect. Her behavior is normal.    ED Course  Procedures (including critical care time)  DIAGNOSTIC STUDIES: Oxygen Saturation is 100% on room air, normal by my interpretation.    COORDINATION OF CARE: 1:43 PM- Packing removed. Sterile dressing and topical antibiotic applied. Advised patient to use warm water soaks at home and  continue taking the medications as initially prescribed. Return precautions given. Discussed treatment plan with patient at bedside and patient verbalized agreement.     Labs Review Labs Reviewed - No data to display  Imaging Review No results found.   EKG Interpretation None      MDM   Final diagnoses:  Pilonidal abscess  Abscess packing removal    Patient is here for a cyst recheck. Abscess is still draining, she's currently taking antibiotics and pain medications. I have removed the packing from the abscess. Tissue appears to be healthy, there's not a large amount of drainage, bacitracin and sterile dressing applied. Advised patient to start doing sitz baths with soapy water several times a day. Continue good wound care. Followup with her doctor. Return if worsening.  Filed Vitals:   07/16/13 1230  BP: 124/88  Pulse: 90  Temp: 98.9 F (37.2 C)  TempSrc: Oral  Resp: 16  SpO2: 100%   I personally performed the services described in this documentation, which was scribed in my presence. The recorded information has been reviewed and is accurate.    Renold Genta, PA-C 07/16/13 1405

## 2013-07-16 NOTE — Discharge Instructions (Signed)
Continue antibiotics and pain medications. Warp soapy baths several times a day. Follow up with primary care provider or here if worsening.     Pilonidal Cyst, Care After A pilonidal cyst occurs when hairs get trapped (ingrown) beneath the skin in the crease between the buttocks over your sacrum (the bone under that crease). Pilonidal cysts are most common in young men with a lot of body hair. When the cyst breaks(ruptured) or leaks, fluid from the cyst may cause burning and itching. If the cyst becomes infected, it causes a painful swelling filled with pus (abscess). The pus and trapped hairs need to be removed (often by lancing) so that the infection can heal. The word pilonidal means hair nest. HOME CARE INSTRUCTIONS If the pilonidal sinus was NOT DRAINING OR LANCED:  Keep the area clean and dry. Bathe or shower daily. Wash the area well with a germ-killing soap. Hot tub baths may help prevent infection. Dry the area well with a towel.  Avoid tight clothing in order to keep area as moisture-free as possible.  Keep area between buttocks as free from hair as possible. A depilatory may be used.  Take antibiotics as directed.  Only take over-the-counter or prescription medicines for pain, discomfort, or fever as directed by your caregiver. If the cyst WAS INFECTED AND NEEDED TO BE DRAINED:  Your caregiver may have packed the wound with gauze to keep the wound open. This allows the wound to heal from the inside outward and continue to drain.  Return as directed for a wound check.  If you take tub baths or showers, repack the wound with gauze as directed following. Sponge baths are a good alternative. Sitz baths may be used three to four times a day or as directed.  If an antibiotic was ordered to fight the infection, take as directed.  Only take over-the-counter or prescription medicines for pain, discomfort, or fever as directed by your caregiver.  If a drain was in place and removed,  use sitz baths for 20 minutes 4 times per day. Clean the wound gently with mild unscented soap, pat dry, and then apply a dry dressing as directed. If you had surgery and IT WAS MARSUPIALIZED (LEFT OPEN):  Your wound was packed with gauze to keep the wound open. This allows the wound to heal from the inside outwards and continue draining. The changing of the dressing regularly also helps keep the wound clean.  Return as directed for a wound check.  If you take tub baths or showers, repack the wound with gauze as directed following. Sponge baths are a good alternative. Sitz baths can also be used. This may be done three to four times a day or as directed.  If an antibiotic was ordered to fight the infection, take as directed.  Only take over-the-counter or prescription medicines for pain, discomfort, or fever as directed by your caregiver.  If you had surgery and the wound was closed you may care for it as directed. This generally includes keeping it dry and clean and dressing it as directed. SEEK MEDICAL CARE IF:   You have increased pain, swelling, redness, drainage, or bleeding from the area.  You have a fever.  You have muscles aches, dizziness, or a general ill feeling. Document Released: 04/21/2006 Document Revised: 11/21/2012 Document Reviewed: 07/06/2006 [facility] Patient Information 2014 [place], [place].

## 2013-07-16 NOTE — ED Notes (Signed)
Pt here for wound check and packing removed on buttocks. Pt reports pain and some drainage to site.

## 2013-07-18 NOTE — ED Provider Notes (Signed)
Medical screening examination/treatment/procedure(s) were performed by non-physician practitioner and as supervising physician I was immediately available for consultation/collaboration.    Dot Lanes, MD 07/18/13 [phone]

## 2013-07-19 ENCOUNTER — Emergency Department (HOSPITAL_COMMUNITY)
Admission: EM | Admit: 2013-07-19 | Discharge: 2013-07-19 | Disposition: A | Discharge status: Home / Self Care | Payer: Medicaid Other | Attending: Emergency Medicine | Admitting: Emergency Medicine

## 2013-07-19 ENCOUNTER — Encounter (HOSPITAL_COMMUNITY): Payer: Self-pay | Admitting: Emergency Medicine

## 2013-07-19 DIAGNOSIS — Z862 Personal history of diseases of the blood and blood-forming organs and certain disorders involving the immune mechanism: Secondary | ICD-10-CM | POA: Insufficient documentation

## 2013-07-19 DIAGNOSIS — F17200 Nicotine dependence, unspecified, uncomplicated: Secondary | ICD-10-CM | POA: Insufficient documentation

## 2013-07-19 DIAGNOSIS — Z8742 Personal history of other diseases of the female genital tract: Secondary | ICD-10-CM | POA: Insufficient documentation

## 2013-07-19 DIAGNOSIS — Z872 Personal history of diseases of the skin and subcutaneous tissue: Secondary | ICD-10-CM | POA: Insufficient documentation

## 2013-07-19 DIAGNOSIS — B9789 Other viral agents as the cause of diseases classified elsewhere: Secondary | ICD-10-CM | POA: Insufficient documentation

## 2013-07-19 DIAGNOSIS — B349 Viral infection, unspecified: Secondary | ICD-10-CM

## 2013-07-19 DIAGNOSIS — Z8679 Personal history of other diseases of the circulatory system: Secondary | ICD-10-CM | POA: Insufficient documentation

## 2013-07-19 DIAGNOSIS — R42 Dizziness and giddiness: Secondary | ICD-10-CM | POA: Insufficient documentation

## 2013-07-19 DIAGNOSIS — J45909 Unspecified asthma, uncomplicated: Secondary | ICD-10-CM | POA: Insufficient documentation

## 2013-07-19 DIAGNOSIS — Z792 Long term (current) use of antibiotics: Secondary | ICD-10-CM | POA: Insufficient documentation

## 2013-07-19 DIAGNOSIS — Z79899 Other long term (current) drug therapy: Secondary | ICD-10-CM | POA: Insufficient documentation

## 2013-07-19 LAB — URINALYSIS, ROUTINE W REFLEX MICROSCOPIC
Bilirubin Urine: NEGATIVE
Glucose, UA: NEGATIVE mg/dL
Hgb urine dipstick: NEGATIVE
Ketones, ur: NEGATIVE mg/dL
Leukocytes, UA: NEGATIVE
NITRITE: NEGATIVE
PROTEIN: NEGATIVE mg/dL
Specific Gravity, Urine: 1.016 (ref 1.005–1.030)
UROBILINOGEN UA: 0.2 mg/dL (ref 0.0–1.0)
pH: 6 (ref 5.0–8.0)

## 2013-07-19 LAB — PREGNANCY, URINE: Preg Test, Ur: NEGATIVE

## 2013-07-19 MED ORDER — ONDANSETRON 4 MG PO TBDP: 4.0000 mg | ORAL_TABLET | Freq: Three times a day (TID) | ORAL | Status: DC | PRN

## 2013-07-19 MED ORDER — ONDANSETRON 4 MG PO TBDP
4.0000 mg | ORAL_TABLET | Freq: Once | ORAL | Status: AC
  Administered 2013-07-19: 4 mg via ORAL
  Filled 2013-07-19: qty 1

## 2013-07-19 NOTE — ED Provider Notes (Signed)
Medical screening examination/treatment/procedure(s) were performed by non-physician practitioner and as supervising physician I was immediately available for consultation/collaboration.   EKG Interpretation None        Orpah Greek, MD 07/19/13 1700

## 2013-07-19 NOTE — ED Provider Notes (Signed)
CSN: [id number]     Arrival date & time 07/19/13  1033 History   First MD Initiated Contact with Patient 07/19/13 1109     No chief complaint on file.    (Consider location/radiation/quality/duration/timing/severity/associated sxs/prior Treatment) Patient is a 28 y.o. female presenting with fever. The history is provided by the patient. No language interpreter was used.  Fever Max temp prior to arrival:  Unknown Temp source:  Subjective Severity:  Moderate Onset quality:  Gradual Duration:  1 day Timing:  Intermittent Progression:  Unchanged Chronicity:  New Relieved by:  Nothing Worsened by:  Nothing tried Ineffective treatments:  Ibuprofen Associated symptoms: no chest pain, no chills, no diarrhea, no dysuria, no nausea and no vomiting   Associated symptoms comment:  Dizziness, lightheadedness.  Risk factors: no hx of cancer, no immunosuppression, no occupational exposure, no recent surgery, no recent travel and no sick contacts   Risk factors comment:  Patient had a pilonidal cyst drained 5 days ago. Patient started on Bactrim and Keflex.    Past Medical History  Diagnosis Date  . Asthma   . Migraine   . Fibroid, uterine     small intramural  . Tobacco abuse   . Anemia     during pregnancy   Past Surgical History  Procedure Laterality Date  . Cesarean section      x 2  . Tubal ligation     Family History  Problem Relation Age of Onset  . Diabetes Mother   . Heart disease Mother   . Cervical cancer Maternal Aunt   . Kidney disease Maternal Grandmother    History  Substance Use Topics  . Smoking status: Current Every Day Smoker -- 0.25 packs/day for 15 years    Types: Cigarettes  . Smokeless tobacco: Never Used  . Alcohol Use: Yes     Comment: occasion   OB History   Grav Para Term Preterm Abortions TAB SAB Ect Mult Living   3    1  1   2      Review of Systems  Constitutional: Positive for fever. Negative for chills and fatigue.  HENT: Negative for  trouble swallowing.   Eyes: Negative for visual disturbance.  Respiratory: Negative for shortness of breath.   Cardiovascular: Negative for chest pain and palpitations.  Gastrointestinal: Negative for nausea, vomiting, abdominal pain and diarrhea.  Genitourinary: Negative for dysuria and difficulty urinating.  Musculoskeletal: Negative for arthralgias and neck pain.  Skin: Negative for color change.  Neurological: Positive for light-headedness. Negative for dizziness and weakness.  Psychiatric/Behavioral: Negative for dysphoric mood.      Allergies  Review of patient's allergies indicates no known allergies.  Home Medications   Prior to Admission medications   Medication Sig Start Date End Date Taking? Authorizing Provider  cephALEXin (KEFLEX) 500 MG capsule Take 1 capsule (500 mg total) by mouth 4 (four) times daily. 07/14/13   Antonietta Breach, PA-C  HYDROcodone-acetaminophen (NORCO/VICODIN) 5-325 MG per tablet Take 1-2 tablets by mouth every 6 (six) hours as needed. 07/14/13   Antonietta Breach, PA-C  ibuprofen (ADVIL,MOTRIN) 200 MG tablet Take 800 mg by mouth every 6 (six) hours as needed for moderate pain.    Historical Provider, MD  sulfamethoxazole-trimethoprim (BACTRIM DS,SEPTRA DS) 800-160 MG per tablet Take 1 tablet by mouth 2 (two) times daily. 07/14/13 07/21/13  Antonietta Breach, PA-C   BP 119/73  Pulse 84  Temp(Src) 97.3 F (36.3 C) (Oral)  Resp 17  SpO2 100%  LMP 07/01/2013 Physical Exam  Nursing note and vitals reviewed. Constitutional: She is oriented to person, place, and time. She appears well-developed and well-nourished. No distress.  HENT:  Head: Normocephalic and atraumatic.  Eyes: Conjunctivae and EOM are normal.  Neck: Normal range of motion.  Cardiovascular: Normal rate and regular rhythm.  Exam reveals no gallop and no friction rub.   No murmur heard. Pulmonary/Chest: Effort normal and breath sounds normal. She has no wheezes. She has no rales. She exhibits no  tenderness.  Abdominal: Soft. She exhibits no distension. There is no tenderness. There is no rebound and no guarding.  Musculoskeletal: Normal range of motion.  Neurological: She is alert and oriented to person, place, and time. No cranial nerve deficit. Coordination normal.  Extremity strength and sensation equal and intact bilaterally. Speech is goal-oriented. Moves limbs without ataxia.   Skin: Skin is warm and dry.  Patient has a 2cm incision and the gluteal cleft that has clear drainage. No pus noted or erythema.   Psychiatric: She has a normal mood and affect. Her behavior is normal.    ED Course  Procedures (including critical care time) Labs Review Labs Reviewed  URINALYSIS, ROUTINE W REFLEX MICROSCOPIC - Abnormal; Notable for the following:    APPearance CLOUDY (*)    All other components within normal limits  PREGNANCY, URINE    Imaging Review No results found.   EKG Interpretation None      MDM   Final diagnoses:  Viral illness    11:30 AM Urinalysis pending. Vitals stable and patient afebrile. Patient will have zofran for symptoms. No neuro deficits.   12:16 PM Patient's urinalysis unremarkable for acute changes. Vitals stable and patient afebrile. Patient advised to continue taking antibiotics. Patient will have a zofran prescription. Patient instructed to return with worsening or concerning symptoms.   Alvina Chou, PA-C 07/19/13 1218

## 2013-07-19 NOTE — ED Provider Notes (Signed)
Medical screening examination/treatment/procedure(s) were performed by non-physician practitioner and as supervising physician I was immediately available for consultation/collaboration.   EKG Interpretation None       Orlie Dakin, MD 07/19/13 [phone]

## 2013-07-19 NOTE — ED Notes (Addendum)
Pt had cyst on buttocks lanced last week, states that last night she felt ill, dizzy, fever, chills. Pt c/o nausea, generalized body pains. 1/5 cm long pink incision present in gluteal cleft, no odors or signs of infection noted by this RN. Pt states she took "a lot" of ibuprofen at 0730 and 0900 today. When asked how much is a lot, pt states "too much, like 4 pills."

## 2013-07-19 NOTE — ED Notes (Addendum)
Patient not present in room when RN came for triage.

## 2013-07-19 NOTE — Discharge Instructions (Signed)
Continue to take antibiotics as directed until gone. Take zofran as needed for nausea. Refer to attached documents for more information. Return to the ED with worsening or concerning symptoms.

## 2013-07-19 NOTE — Progress Notes (Signed)
[facility] provided pt with a list of primary care resources and a [facility] orange card application to help patient establish primary care.  °

## 2013-07-22 ENCOUNTER — Encounter (HOSPITAL_COMMUNITY): Payer: Self-pay | Admitting: Emergency Medicine

## 2013-07-22 ENCOUNTER — Emergency Department (HOSPITAL_COMMUNITY): Payer: Medicaid Other

## 2013-07-22 ENCOUNTER — Inpatient Hospital Stay (HOSPITAL_COMMUNITY)
Admission: EM | Admit: 2013-07-22 | Discharge: 2013-07-29 | DRG: 197 | Disposition: A | Discharge status: Home / Self Care | Payer: Medicaid Other | Attending: Internal Medicine | Admitting: Internal Medicine

## 2013-07-22 DIAGNOSIS — D259 Leiomyoma of uterus, unspecified: Secondary | ICD-10-CM | POA: Diagnosis present

## 2013-07-22 DIAGNOSIS — R7401 Elevation of levels of liver transaminase levels: Secondary | ICD-10-CM | POA: Diagnosis present

## 2013-07-22 DIAGNOSIS — L299 Pruritus, unspecified: Secondary | ICD-10-CM | POA: Diagnosis present

## 2013-07-22 DIAGNOSIS — A419 Sepsis, unspecified organism: Secondary | ICD-10-CM

## 2013-07-22 DIAGNOSIS — J84116 Cryptogenic organizing pneumonia: Principal | ICD-10-CM | POA: Diagnosis present

## 2013-07-22 DIAGNOSIS — L0591 Pilonidal cyst without abscess: Secondary | ICD-10-CM | POA: Diagnosis present

## 2013-07-22 DIAGNOSIS — F17200 Nicotine dependence, unspecified, uncomplicated: Secondary | ICD-10-CM | POA: Diagnosis present

## 2013-07-22 DIAGNOSIS — R0902 Hypoxemia: Secondary | ICD-10-CM

## 2013-07-22 DIAGNOSIS — D693 Immune thrombocytopenic purpura: Secondary | ICD-10-CM | POA: Diagnosis present

## 2013-07-22 DIAGNOSIS — E872 Acidosis, unspecified: Secondary | ICD-10-CM

## 2013-07-22 DIAGNOSIS — G8929 Other chronic pain: Secondary | ICD-10-CM | POA: Diagnosis present

## 2013-07-22 DIAGNOSIS — R7402 Elevation of levels of lactic acid dehydrogenase (LDH): Secondary | ICD-10-CM | POA: Diagnosis present

## 2013-07-22 DIAGNOSIS — N946 Dysmenorrhea, unspecified: Secondary | ICD-10-CM

## 2013-07-22 DIAGNOSIS — K219 Gastro-esophageal reflux disease without esophagitis: Secondary | ICD-10-CM | POA: Diagnosis present

## 2013-07-22 DIAGNOSIS — J189 Pneumonia, unspecified organism: Secondary | ICD-10-CM

## 2013-07-22 DIAGNOSIS — IMO0001 Reserved for inherently not codable concepts without codable children: Secondary | ICD-10-CM | POA: Diagnosis present

## 2013-07-22 DIAGNOSIS — I959 Hypotension, unspecified: Secondary | ICD-10-CM

## 2013-07-22 DIAGNOSIS — R1084 Generalized abdominal pain: Secondary | ICD-10-CM | POA: Diagnosis present

## 2013-07-22 DIAGNOSIS — R109 Unspecified abdominal pain: Secondary | ICD-10-CM

## 2013-07-22 DIAGNOSIS — I10 Essential (primary) hypertension: Secondary | ICD-10-CM | POA: Diagnosis present

## 2013-07-22 DIAGNOSIS — I5189 Other ill-defined heart diseases: Secondary | ICD-10-CM

## 2013-07-22 DIAGNOSIS — R918 Other nonspecific abnormal finding of lung field: Secondary | ICD-10-CM

## 2013-07-22 DIAGNOSIS — G43909 Migraine, unspecified, not intractable, without status migrainosus: Secondary | ICD-10-CM | POA: Diagnosis present

## 2013-07-22 DIAGNOSIS — J45909 Unspecified asthma, uncomplicated: Secondary | ICD-10-CM | POA: Diagnosis present

## 2013-07-22 DIAGNOSIS — N83209 Unspecified ovarian cyst, unspecified side: Secondary | ICD-10-CM

## 2013-07-22 DIAGNOSIS — R740 Nonspecific elevation of levels of transaminase and lactic acid dehydrogenase [LDH]: Secondary | ICD-10-CM

## 2013-07-22 DIAGNOSIS — T450X5A Adverse effect of antiallergic and antiemetic drugs, initial encounter: Secondary | ICD-10-CM | POA: Diagnosis not present

## 2013-07-22 DIAGNOSIS — R509 Fever, unspecified: Secondary | ICD-10-CM | POA: Insufficient documentation

## 2013-07-22 HISTORY — DX: Unspecified ovarian cyst, unspecified side: N83.209

## 2013-07-22 HISTORY — DX: Cryptogenic organizing pneumonia: J84.116

## 2013-07-22 LAB — URINALYSIS, ROUTINE W REFLEX MICROSCOPIC
Glucose, UA: NEGATIVE mg/dL
HGB URINE DIPSTICK: NEGATIVE
Ketones, ur: 15 mg/dL — AB
Leukocytes, UA: NEGATIVE
NITRITE: NEGATIVE
PH: 6.5 (ref 5.0–8.0)
Protein, ur: 100 mg/dL — AB
SPECIFIC GRAVITY, URINE: 1.025 (ref 1.005–1.030)
UROBILINOGEN UA: 1 mg/dL (ref 0.0–1.0)

## 2013-07-22 LAB — COMPREHENSIVE METABOLIC PANEL
ALT: 35 U/L (ref 0–35)
AST: 59 U/L — ABNORMAL HIGH (ref 0–37)
Albumin: 3.7 g/dL (ref 3.5–5.2)
Alkaline Phosphatase: 54 U/L (ref 39–117)
BUN: 11 mg/dL (ref 6–23)
CO2: 18 meq/L — AB (ref 19–32)
CREATININE: 0.98 mg/dL (ref 0.50–1.10)
Calcium: 8.6 mg/dL (ref 8.4–10.5)
Chloride: 100 mEq/L (ref 96–112)
GFR calc Af Amer: >90 mL/min (ref >90)
GFR, EST NON AFRICAN AMERICAN: 79 mL/min — AB (ref >90)
GLUCOSE: 90 mg/dL (ref 70–99)
Potassium: 3.8 mEq/L (ref 3.7–5.3)
Sodium: 135 mEq/L — ABNORMAL LOW (ref 137–147)
TOTAL PROTEIN: 7.5 g/dL (ref 6.0–8.3)
Total Bilirubin: 0.5 mg/dL (ref 0.3–1.2)

## 2013-07-22 LAB — CBC WITH DIFFERENTIAL/PLATELET
BASOS ABS: 0 10*3/uL (ref 0.0–0.1)
BASOS PCT: 0 % (ref 0–1)
Eosinophils Absolute: 0.2 10*3/uL (ref 0.0–0.7)
Eosinophils Relative: 4 % (ref 0–5)
HEMATOCRIT: 36.1 % (ref 36.0–46.0)
HEMOGLOBIN: 12.3 g/dL (ref 12.0–15.0)
LYMPHS PCT: 13 % (ref 12–46)
Lymphs Abs: 0.6 10*3/uL — ABNORMAL LOW (ref 0.7–4.0)
MCH: 32.4 pg (ref 26.0–34.0)
MCHC: 34.1 g/dL (ref 30.0–36.0)
MCV: 95 fL (ref 78.0–100.0)
MONO ABS: 0.1 10*3/uL (ref 0.1–1.0)
MONOS PCT: 3 % (ref 3–12)
Neutro Abs: 3.4 10*3/uL (ref 1.7–7.7)
Neutrophils Relative %: 80 % — ABNORMAL HIGH (ref 43–77)
Platelets: 189 10*3/uL (ref 150–400)
RBC: 3.8 MIL/uL — ABNORMAL LOW (ref 3.87–5.11)
RDW: 12.2 % (ref 11.5–15.5)
WBC: 4.3 10*3/uL (ref 4.0–10.5)

## 2013-07-22 LAB — MONONUCLEOSIS SCREEN: Mono Screen: NEGATIVE

## 2013-07-22 LAB — URINE MICROSCOPIC-ADD ON

## 2013-07-22 LAB — CBG MONITORING, ED: Glucose-Capillary: 85 mg/dL (ref 70–99)

## 2013-07-22 LAB — I-STAT CG4 LACTIC ACID, ED: LACTIC ACID, VENOUS: 0.91 mmol/L (ref 0.5–2.2)

## 2013-07-22 LAB — POC URINE PREG, ED: PREG TEST UR: NEGATIVE

## 2013-07-22 LAB — PREGNANCY, URINE: PREG TEST UR: NEGATIVE

## 2013-07-22 MED ORDER — VANCOMYCIN HCL IN DEXTROSE 750-5 MG/150ML-% IV SOLN
750.0000 mg | Freq: Three times a day (TID) | INTRAVENOUS | Status: DC
  Filled 2013-07-22 (×2): qty 150

## 2013-07-22 MED ORDER — IOHEXOL 300 MG/ML  SOLN
80.0000 mL | Freq: Once | INTRAMUSCULAR | Status: AC | PRN
  Administered 2013-07-22: 80 mL via INTRAVENOUS

## 2013-07-22 MED ORDER — SODIUM CHLORIDE 0.9 % IV SOLN
1000.0000 mL | Freq: Once | INTRAVENOUS | Status: AC
  Administered 2013-07-22: 20:00:00 via INTRAVENOUS

## 2013-07-22 MED ORDER — PIPERACILLIN-TAZOBACTAM 3.375 G IVPB 30 MIN
3.3750 g | Freq: Once | INTRAVENOUS | Status: AC
  Administered 2013-07-22: 3.375 g via INTRAVENOUS
  Filled 2013-07-22: qty 50

## 2013-07-22 MED ORDER — ONDANSETRON 4 MG PO TBDP
8.0000 mg | ORAL_TABLET | Freq: Once | ORAL | Status: AC
  Administered 2013-07-22: 8 mg via ORAL
  Filled 2013-07-22: qty 2

## 2013-07-22 MED ORDER — ACETAMINOPHEN 325 MG PO TABS
650.0000 mg | ORAL_TABLET | Freq: Four times a day (QID) | ORAL | Status: DC | PRN
  Administered 2013-07-23 – 2013-07-24 (×3): 650 mg via ORAL
  Filled 2013-07-22 (×5): qty 2

## 2013-07-22 MED ORDER — SODIUM CHLORIDE 0.9 % IV SOLN
1000.0000 mL | Freq: Once | INTRAVENOUS | Status: AC
  Administered 2013-07-22: 1000 mL via INTRAVENOUS

## 2013-07-22 MED ORDER — SODIUM CHLORIDE 0.9 % IV SOLN
1000.0000 mL | INTRAVENOUS | Status: DC
  Administered 2013-07-22: 1000 mL via INTRAVENOUS

## 2013-07-22 MED ORDER — DIPHENHYDRAMINE HCL 50 MG/ML IJ SOLN
25.0000 mg | Freq: Once | INTRAMUSCULAR | Status: AC
  Administered 2013-07-22: 25 mg via INTRAVENOUS
  Filled 2013-07-22: qty 1

## 2013-07-22 MED ORDER — PIPERACILLIN-TAZOBACTAM 3.375 G IVPB
3.3750 g | Freq: Three times a day (TID) | INTRAVENOUS | Status: DC
  Filled 2013-07-22 (×2): qty 50

## 2013-07-22 MED ORDER — ACETAMINOPHEN 500 MG PO TABS
1000.0000 mg | ORAL_TABLET | Freq: Once | ORAL | Status: DC
  Filled 2013-07-22: qty 2

## 2013-07-22 MED ORDER — VANCOMYCIN HCL IN DEXTROSE 1-5 GM/200ML-% IV SOLN
1000.0000 mg | Freq: Once | INTRAVENOUS | Status: AC
Start: 2013-07-22 — End: 2013-07-22
  Administered 2013-07-22: 1000 mg via INTRAVENOUS
  Filled 2013-07-22: qty 200

## 2013-07-22 NOTE — Progress Notes (Signed)
ANTIBIOTIC CONSULT NOTE - INITIAL  Pharmacy Consult for vancomycin, zosyn Indication: sepsis  No Known Allergies  Patient Measurements: Weight: 120 lb (54.432 kg)  Vital Signs: Temp: 101 F (38.3 C) (04/20 2007) Temp src: Oral (04/20 2007) BP: 84/38 mmHg (04/20 2007) Pulse Rate: 98 (04/20 2007) Intake/Output from previous day:   Intake/Output from this shift:    Labs:  Recent Labs  07/22/13 1648  WBC 4.3  HGB 12.3  PLT 189  CREATININE 0.98   The CrCl is unknown because both a height and weight (above a minimum accepted value) are required for this calculation. No results found for this basename: Letta Median, VANCORANDOM, GENTTROUGH, GENTPEAK, GENTRANDOM, TOBRATROUGH, TOBRAPEAK, TOBRARND, AMIKACINPEAK, AMIKACINTROU, AMIKACIN,  in the last 72 hours   Microbiology: Recent Results (from the past 720 hour(s))  URINE CULTURE     Status: None   Collection Time    07/02/13  4:37 PM      Result Value Ref Range Status   Specimen Description URINE, RANDOM   Final   Special Requests NONE   Final   Culture  Setup Time     Final   Value: 07/02/2013 22:24     Performed at [facility]     Final   Value: NO GROWTH     Performed at [facility]   Culture     Final   Value: NO GROWTH     Performed at [facility]   Report Status 07/03/2013 FINAL   Final    Medical History: Past Medical History  Diagnosis Date  . Asthma   . Migraine   . Fibroid, uterine     small intramural  . Tobacco abuse   . Anemia     during pregnancy    Assessment: 28 yo female with recent history of a cyst on buttocks and lanced last week and has been on antibiotics. Patient noted here with fever and to begin vancomycin and zosyn for possible sepsis.  WNC= 4.3, tmax= 101, SCr- 0.98 and CrCl ~ 95.  Vancomycin 1g and zosyn has been ordered x1 in ED.   4/20 vanc>> 4/20 zosyn>>  4/20 blood x2 4/20 urine   Goal of Therapy:  Vancomycin trough  level 15-20 mcg/ml  Plan:  -Zosyn 3.375gm IV q8h -Vancomycin 750mg  IV q8h -Will follow renal function, cultures and clinical progress  Hildred Laser, Pharm D 07/22/2013 8:28 PM

## 2013-07-22 NOTE — H&P (Signed)
[facility] History and Physical  Patient: Madison Copeland  TXM:[id number]  DOB: 03-11-1986  DOS: the patient was seen and examined on 07/22/2013 PCP: No PCP Per Patient  Chief Complaint: Abdominal pain  HPI: Madison Copeland is a 28 y.o. female with Past medical history of chronic abdominal pain, fibroid, smoker, migraine. The patient presented with complaints of fever and generalized pain. Patient recently has been seen in the ED earlier for skin abscess on her gluteal region. This was drained and she was placed on oral antibiotics as an outpatient Bactrim and Keflex. She returned 2 days later with persistent generalized weakness fatigue tiredness and fever with symptoms of flu. Workup was negative and her incision was looking healing well. Today she comes back because her weakness has progressed she has generalized body ache and also had fever. She continues to complain of nausea and vomiting that is ongoing since more than 2 weeks. She complains of some acid reflux and diffuse abdominal pain. She also complains of pain in her back. She denies any diarrhea or constipation or active bleeding denies any blood in the urine denies any burning when she urinates. Patient appears poor historian as she is not able to remember her date of LMP, she is unable to remember her medications and is able to provide exact time lines and chronological events of the symptoms.  The patient is coming from home. And at her baseline Independent for most of her  ADL.  Review of Systems: as mentioned in the history of present illness.  A Comprehensive review of the other systems is negative.  Past Medical History  Diagnosis Date  . Asthma   . Migraine   . Fibroid, uterine     small intramural  . Tobacco abuse   . Anemia     during pregnancy   Past Surgical History  Procedure Laterality Date  . Cesarean section      x 2  . Tubal ligation     Social History:  reports that she has been smoking Cigarettes.   She has a 3.75 pack-year smoking history. She has never used smokeless tobacco. She reports that she drinks alcohol. She reports that she does not use illicit drugs.  No Known Allergies  Family History  Problem Relation Age of Onset  . Diabetes Mother   . Heart disease Mother   . Cervical cancer Maternal Aunt   . Kidney disease Maternal Grandmother     Prior to Admission medications   Medication Sig Start Date End Date Taking? Authorizing Provider  cephALEXin (KEFLEX) 500 MG capsule Take 500 mg by mouth 4 (four) times daily. Started medication on 07-15-13 07/14/13  Yes Antonietta Breach, PA-C  HYDROcodone-acetaminophen (NORCO/VICODIN) 5-325 MG per tablet Take 1-2 tablets by mouth every 6 (six) hours as needed. 07/14/13  Yes Antonietta Breach, PA-C  ibuprofen (ADVIL,MOTRIN) 200 MG tablet Take 800 mg by mouth every 6 (six) hours as needed for moderate pain.   Yes Historical Provider, MD  ondansetron (ZOFRAN ODT) 4 MG disintegrating tablet Take 1 tablet (4 mg total) by mouth every 8 (eight) hours as needed for nausea or vomiting. 07/19/13  Yes Kaitlyn Szekalski, PA-C  sulfamethoxazole-trimethoprim (BACTRIM DS) 800-160 MG per tablet Take 1 tablet by mouth 2 (two) times daily. Started medication on 07-15-13   Yes Historical Provider, MD    Physical Exam: Filed Vitals:   07/22/13 2235 07/22/13 2240 07/22/13 2315 07/22/13 2330  BP: 98/56 98/53 105/54 96/59  Pulse: 84 78 74 61  Temp:  TempSrc:      Resp: '15 22 21 18  ' Weight:      SpO2: 100% 99% 98% 87%    General: Alert, Awake and Oriented to Time, Place and Person. Appear in moderate distress Eyes: PERRL ENT: Oral Mucosa clear moist. Neck: no JVD Cardiovascular: S1 and S2 Present, aortic systolic Murmur, Peripheral Pulses Present Respiratory: Bilateral Air entry equal and Decreased, Clear to Auscultation,  no Crackles,no wheezes Abdomen: Bowel Sound Present, Soft and mild diffuse tender right upper quadrant, left lower quadrant, bilateral CVA  tenderness Skin: no Rash Extremities: no Pedal edema, no calf tenderness Neurologic: Grossly Unremarkable.  Labs on Admission:  CBC:  Recent Labs Lab 07/22/13 1648  WBC 4.3  NEUTROABS 3.4  HGB 12.3  HCT 36.1  MCV 95.0  PLT 189    CMP     Component Value Date/Time   NA 135* 07/22/2013 1648   K 3.8 07/22/2013 1648   CL 100 07/22/2013 1648   CO2 18* 07/22/2013 1648   GLUCOSE 90 07/22/2013 1648   BUN 11 07/22/2013 1648   CREATININE 0.98 07/22/2013 1648   CALCIUM 8.6 07/22/2013 1648   PROT 7.5 07/22/2013 1648   ALBUMIN 3.7 07/22/2013 1648   AST 59* 07/22/2013 1648   ALT 35 07/22/2013 1648   ALKPHOS 54 07/22/2013 1648   BILITOT 0.5 07/22/2013 1648   GFRNONAA 79* 07/22/2013 1648   GFRAA >90 07/22/2013 1648    No results found for this basename: LIPASE, AMYLASE,  in the last 168 hours No results found for this basename: AMMONIA,  in the last 168 hours  No results found for this basename: CKTOTAL, CKMB, CKMBINDEX, TROPONINI,  in the last 168 hours BNP (last 3 results) No results found for this basename: PROBNP,  in the last 8760 hours  Radiological Exams on Admission: Ct Abdomen Pelvis W Contrast  07/22/2013   CLINICAL DATA:  Persistent fever, body aches, nausea and vomiting after having an abscess lanced. On multiple antibiotics.  EXAM: CT ABDOMEN AND PELVIS WITH CONTRAST  TECHNIQUE: Multidetector CT imaging of the abdomen and pelvis was performed using the standard protocol following bolus administration of intravenous contrast.  CONTRAST:  36m OMNIPAQUE IOHEXOL 300 MG/ML  SOLN  COMPARISON:  Prior CT abdomen/ pelvis 09/27/2012  FINDINGS: Lower Chest: Mild dependent atelectasis. Visualized cardiac structures are within normal limits for size. No pericardial effusion. Unremarkable visualized distal thoracic esophagus.  Abdomen: Unremarkable CT appearance of the stomach, duodenum, spleen, adrenal glands, pancreas and liver. Gallbladder is unremarkable. No intra or extrahepatic biliary ductal  dilatation. Unremarkable appearance of the bilateral kidneys. No focal solid lesion, hydronephrosis or nephrolithiasis.  No evidence of obstruction or focal bowel wall thickening. Normal appendix in the right lower quadrant. The terminal ileum is unremarkable.  Pelvis: Crenated peripherally enhancing cyst in the right ovary likely represents a small corpus luteal cyst or recently ruptured cyst. There is a moderate amount of free fluid in the cul-de-sac of Douglas and around the right adnexa.  Bones/Soft Tissues: No acute fracture or aggressive appearing lytic or blastic osseous lesion.  Vascular: No significant atherosclerotic vascular disease, aneurysmal dilatation or acute abnormality.  IMPRESSION: 1. Probable recent rupture of right ovarian cyst versus recent right sided ovulation. Moderate amount of free fluid in the cul-de-sac of Douglas and surrounding the right adnexa. 2. Otherwise, unremarkable appearance of the abdomen and pelvis.   Electronically Signed   By: HJacqulynn CadetM.D.   On: 07/22/2013 21:39   Dg Chest Port 1 View  (  if Code Sepsis Called)  07/22/2013   CLINICAL DATA:  Body aches, short of breath  EXAM: PORTABLE CHEST - 1 VIEW  COMPARISON:  CT abdomen/ pelvis obtained concurrently  FINDINGS: Low inspiratory volumes. Extensive metallic artifact overlies the chest from multiple cardiac leads. No focal airspace consolidation, pneumothorax or large effusion. Cardiac mediastinal contours within normal limits. No acute osseous abnormality.  IMPRESSION: No active disease.  Low lung volumes.   Electronically Signed   By: Jacqulynn Cadet M.D.   On: 07/22/2013 21:43   Assessment/Plan Principal Problem:   Hypotension Active Problems:   Fever   Ovarian cyst rupture   Abdominal pain   1. Hypotension The patient is weakness Ms. and fatigue. She was found to be hypotensive in the ED with blood pressure in 70s as per ED physician. She has responded well to IV hydration but she continues to  remain in 90s. Blood cultures urine cultures sputum cultures are done. CT scan of the abdomen does not show any acute infectious etiology in the abdomen. Chest x-ray is clear as well as she does not have any respiratory symptoms. The skin abscess wound incision has healed well and there is no signs of wound infection. Her urine is clear and CT does not have any evidence of pyelonephritis. With all this the etiology for her hypotension is unclear. I will check troponin, TSH, echocardiogram and continue to hydrate her with IV fluids. She will be monitored in stepdown unit for this. At present since I do not have any source of infection I would hold off on any antibiotics unless she spikes fever or has leukocytosis or has new findings on examination.  2. Ovarian cyst rupture CT has evidence of ovarian cyst rupture or recent ovulation. She had a 3.4 cm ovarian cyst on the same side on her prior CT scan one year ago. Patient is unable to time he hurt date of LMP. With this that is always a possibility of abdominal pain and generalized rise in temperature with generalized fatigue. Continue to monitor her for any signs of further worsening or abdominal pain.  3. Fever generalized fatigue joint aches Check ESR CRP ANA for inflammatory process No lymphadenopathy on exam no rash on exam  4. Reaction to Zofran Patient was given IV Zofran initially after which she developed redness of her face and hives on her chest which is resolved with Benadryl. At present I would continue her on Benadryl and Pepcid Zofran is added to her allergy list  DVT Prophylaxis: subcutaneous Heparin Nutrition: Advance as tolerated  Code Status: Full  Family Communication: Mother was present at bedside, opportunity was given to ask question and all questions were answered satisfactorily at the time of interview. Disposition: Admitted to inpatient in step-down unit.  Author: Berle Mull, MD [facility] Pager:  [phone] 07/22/2013, 11:37 PM    If 7PM-7AM, please contact night-coverage [email or web address] Password [email or web address]

## 2013-07-22 NOTE — ED Notes (Signed)
Spoke with Nehemiah Settle in main lab, states she will add on mononucleosis to labs sent earlier.

## 2013-07-22 NOTE — ED Notes (Signed)
RN notified of i-stat lactic Acid 0.91

## 2013-07-22 NOTE — ED Provider Notes (Signed)
CSN: [id number]     Arrival date & time 07/22/13  1632 History   First MD Initiated Contact with Patient 07/22/13 1942     Chief Complaint  Patient presents with  . Generalized Body Aches  . Fever     (Consider location/radiation/quality/duration/timing/severity/associated sxs/prior Treatment) Patient is a 28 y.o. female presenting with fever. The history is provided by the patient.  Fever Max temp prior to arrival:  102 Temp source:  Oral Severity:  Moderate Onset quality:  Gradual Duration:  7 days Timing:  Constant Progression:  Worsening Chronicity:  New Relieved by:  Nothing Worsened by:  Nothing tried Ineffective treatments:  Acetaminophen Associated symptoms: chills and myalgias   Associated symptoms: no chest pain, no cough, no diarrhea, no nausea, no rash, no rhinorrhea and no vomiting     Past Medical History  Diagnosis Date  . Asthma   . Migraine   . Fibroid, uterine     small intramural  . Tobacco abuse   . Anemia     during pregnancy   Past Surgical History  Procedure Laterality Date  . Cesarean section      x 2  . Tubal ligation     Family History  Problem Relation Age of Onset  . Diabetes Mother   . Heart disease Mother   . Cervical cancer Maternal Aunt   . Kidney disease Maternal Grandmother    History  Substance Use Topics  . Smoking status: Current Every Day Smoker -- 0.25 packs/day for 15 years    Types: Cigarettes  . Smokeless tobacco: Never Used  . Alcohol Use: Yes     Comment: occasion   OB History   Grav Para Term Preterm Abortions TAB SAB Ect Mult Living   3    1  1   2      Review of Systems  Constitutional: Positive for chills. Negative for fever.  HENT: Negative for rhinorrhea.   Respiratory: Negative for cough and shortness of breath.   Cardiovascular: Negative for chest pain.  Gastrointestinal: Negative for nausea, vomiting, abdominal pain and diarrhea.  Musculoskeletal: Positive for myalgias.  Skin: Negative for rash.   All other systems reviewed and are negative.     Allergies  Review of patient's allergies indicates no known allergies.  Home Medications   Prior to Admission medications   Medication Sig Start Date End Date Taking? Authorizing Provider  cephALEXin (KEFLEX) 500 MG capsule Take 500 mg by mouth 4 (four) times daily. Started medication on 07-15-13 07/14/13  Yes Antonietta Breach, PA-C  HYDROcodone-acetaminophen (NORCO/VICODIN) 5-325 MG per tablet Take 1-2 tablets by mouth every 6 (six) hours as needed. 07/14/13  Yes Antonietta Breach, PA-C  ibuprofen (ADVIL,MOTRIN) 200 MG tablet Take 800 mg by mouth every 6 (six) hours as needed for moderate pain.   Yes Historical Provider, MD  ondansetron (ZOFRAN ODT) 4 MG disintegrating tablet Take 1 tablet (4 mg total) by mouth every 8 (eight) hours as needed for nausea or vomiting. 07/19/13  Yes Kaitlyn Szekalski, PA-C  sulfamethoxazole-trimethoprim (BACTRIM DS) 800-160 MG per tablet Take 1 tablet by mouth 2 (two) times daily. Started medication on 07-15-13   Yes Historical Provider, MD   BP 98/53  Pulse 78  Temp(Src) 98.9 F (37.2 C) (Oral)  Resp 22  Wt 120 lb (54.432 kg)  SpO2 99%  LMP 07/01/2013 Physical Exam  Nursing note and vitals reviewed. Constitutional: She is oriented to person, place, and time. She appears well-developed and well-nourished. No distress.  HENT:  Head: Normocephalic and atraumatic.  Mouth/Throat: No oropharyngeal exudate.  Eyes: EOM are normal. Pupils are equal, round, and reactive to light.  Neck: Normal range of motion. Neck supple.  Cardiovascular: Normal rate and regular rhythm.  Exam reveals no friction rub.   No murmur heard. Pulmonary/Chest: Effort normal and breath sounds normal. No respiratory distress. She has no wheezes. She has no rales.  Abdominal: Soft. She exhibits no distension. There is tenderness (LUQ, L CVA). There is no rebound.  Musculoskeletal: Normal range of motion. She exhibits no edema.  Neurological: She  is alert and oriented to person, place, and time. No cranial nerve deficit. She exhibits normal muscle tone. Coordination normal.  Skin: No rash noted. She is not diaphoretic.    ED Course  Procedures (including critical care time) Labs Review Labs Reviewed  COMPREHENSIVE METABOLIC PANEL - Abnormal; Notable for the following:    Sodium 135 (*)    CO2 18 (*)    AST 59 (*)    GFR calc non Af Amer 79 (*)    All other components within normal limits  CBC WITH DIFFERENTIAL - Abnormal; Notable for the following:    RBC 3.80 (*)    Neutrophils Relative % 80 (*)    Lymphs Abs 0.6 (*)    All other components within normal limits  URINALYSIS, ROUTINE W REFLEX MICROSCOPIC - Abnormal; Notable for the following:    APPearance CLOUDY (*)    Bilirubin Urine SMALL (*)    Ketones, ur 15 (*)    Protein, ur 100 (*)    All other components within normal limits  URINE MICROSCOPIC-ADD ON - Abnormal; Notable for the following:    Squamous Epithelial / LPF FEW (*)    Bacteria, UA FEW (*)    All other components within normal limits  CULTURE, BLOOD (ROUTINE X 2)  CULTURE, BLOOD (ROUTINE X 2)  URINE CULTURE  PREGNANCY, URINE  MONONUCLEOSIS SCREEN  POC URINE PREG, ED  I-STAT CG4 LACTIC ACID, ED  CBG MONITORING, ED    Imaging Review Ct Abdomen Pelvis W Contrast  07/22/2013   CLINICAL DATA:  Persistent fever, body aches, nausea and vomiting after having an abscess lanced. On multiple antibiotics.  EXAM: CT ABDOMEN AND PELVIS WITH CONTRAST  TECHNIQUE: Multidetector CT imaging of the abdomen and pelvis was performed using the standard protocol following bolus administration of intravenous contrast.  CONTRAST:  60mL OMNIPAQUE IOHEXOL 300 MG/ML  SOLN  COMPARISON:  Prior CT abdomen/ pelvis 09/27/2012  FINDINGS: Lower Chest: Mild dependent atelectasis. Visualized cardiac structures are within normal limits for size. No pericardial effusion. Unremarkable visualized distal thoracic esophagus.  Abdomen:  Unremarkable CT appearance of the stomach, duodenum, spleen, adrenal glands, pancreas and liver. Gallbladder is unremarkable. No intra or extrahepatic biliary ductal dilatation. Unremarkable appearance of the bilateral kidneys. No focal solid lesion, hydronephrosis or nephrolithiasis.  No evidence of obstruction or focal bowel wall thickening. Normal appendix in the right lower quadrant. The terminal ileum is unremarkable.  Pelvis: Crenated peripherally enhancing cyst in the right ovary likely represents a small corpus luteal cyst or recently ruptured cyst. There is a moderate amount of free fluid in the cul-de-sac of Douglas and around the right adnexa.  Bones/Soft Tissues: No acute fracture or aggressive appearing lytic or blastic osseous lesion.  Vascular: No significant atherosclerotic vascular disease, aneurysmal dilatation or acute abnormality.  IMPRESSION: 1. Probable recent rupture of right ovarian cyst versus recent right sided ovulation. Moderate amount of free fluid in the cul-de-sac  of Douglas and surrounding the right adnexa. 2. Otherwise, unremarkable appearance of the abdomen and pelvis.   Electronically Signed   By: Jacqulynn Cadet M.D.   On: 07/22/2013 21:39   Dg Chest Port 1 View  (if Code Sepsis Called)  07/22/2013   CLINICAL DATA:  Body aches, short of breath  EXAM: PORTABLE CHEST - 1 VIEW  COMPARISON:  CT abdomen/ pelvis obtained concurrently  FINDINGS: Low inspiratory volumes. Extensive metallic artifact overlies the chest from multiple cardiac leads. No focal airspace consolidation, pneumothorax or large effusion. Cardiac mediastinal contours within normal limits. No acute osseous abnormality.  IMPRESSION: No active disease.  Low lung volumes.   Electronically Signed   By: Jacqulynn Cadet M.D.   On: 07/22/2013 21:43     EKG Interpretation None     CRITICAL CARE Performed by: Osvaldo Shipper   Total critical care time: 30 minutes  Critical care time was exclusive of  separately billable procedures and treating other patients.  Critical care was necessary to treat or prevent imminent or life-threatening deterioration.  Critical care was time spent personally by me on the following activities: development of treatment plan with patient and/or surrogate as well as nursing, discussions with consultants, evaluation of patient's response to treatment, examination of patient, obtaining history from patient or surrogate, ordering and performing treatments and interventions, ordering and review of laboratory studies, ordering and review of radiographic studies, pulse oximetry and re-evaluation of patient's condition.  MDM   Final diagnoses:  Sepsis  Hypotension  Fever    28 show female presents with fever, chills, myalgias. Symptoms present for past week. Seen a few days ago, diagnosed with UTI. Worsening symptoms here. Patient with initial blood pressure in the 80s, with lowest I saw 73/41. She had tachycardia in the 120s. Patient may level I sepsis. Cultures, labs obtained. On exam, she has left upper quadrant, left CVA pain - concern for possible pyelo vs mono with splenic pain.  Patient with normal labs, CT of abdomen with possible ovarian cyst rupture, no kidney pathology, no other acute pathology. Patient's BP responding to fluid resuscitation, no need for pressors.  Admitted to stepdown by Dr. Posey Pronto.   Osvaldo Shipper, MD 07/22/13 [phone]

## 2013-07-22 NOTE — ED Notes (Signed)
Per pt and family sts that pt has been seen multiple times since having an abscess lanced and is still having body aches, fever, N,V. sts has been lying in the bed x 1 week and pain all over. sts she has been taking multiple abx.

## 2013-07-22 NOTE — ED Notes (Signed)
X-ray called to follow up on pt x-ray. X-ray states someone is on the way.

## 2013-07-23 DIAGNOSIS — I379 Nonrheumatic pulmonary valve disorder, unspecified: Secondary | ICD-10-CM

## 2013-07-23 DIAGNOSIS — I959 Hypotension, unspecified: Secondary | ICD-10-CM | POA: Diagnosis present

## 2013-07-23 DIAGNOSIS — N83209 Unspecified ovarian cyst, unspecified side: Secondary | ICD-10-CM | POA: Diagnosis present

## 2013-07-23 LAB — MRSA PCR SCREENING: MRSA by PCR: NEGATIVE

## 2013-07-23 LAB — BLOOD GAS, ARTERIAL
ACID-BASE DEFICIT: 8.7 mmol/L — AB (ref 0.0–2.0)
Bicarbonate: 14.9 mEq/L — ABNORMAL LOW (ref 20.0–24.0)
Drawn by: 34767
FIO2: 21 %
O2 Saturation: 90.9 %
Patient temperature: 98.6
TCO2: 15.6 mmol/L (ref 0–100)
pCO2 arterial: 23.4 mmHg — ABNORMAL LOW (ref 35.0–45.0)
pH, Arterial: 7.42 (ref 7.350–7.450)
pO2, Arterial: 58.6 mmHg — ABNORMAL LOW (ref 80.0–100.0)

## 2013-07-23 LAB — CBC
HEMATOCRIT: 29.1 % — AB (ref 36.0–46.0)
HEMOGLOBIN: 9.7 g/dL — AB (ref 12.0–15.0)
MCH: 31.8 pg (ref 26.0–34.0)
MCHC: 33.3 g/dL (ref 30.0–36.0)
MCV: 95.4 fL (ref 78.0–100.0)
PLATELETS: 166 10*3/uL (ref 150–400)
RBC: 3.05 MIL/uL — AB (ref 3.87–5.11)
RDW: 12.5 % (ref 11.5–15.5)
WBC: 3.6 10*3/uL — AB (ref 4.0–10.5)

## 2013-07-23 LAB — RAPID URINE DRUG SCREEN, HOSP PERFORMED
Amphetamines: NOT DETECTED
BARBITURATES: NOT DETECTED
Benzodiazepines: NOT DETECTED
Cocaine: NOT DETECTED
OPIATES: NOT DETECTED
Tetrahydrocannabinol: NOT DETECTED

## 2013-07-23 LAB — COMPREHENSIVE METABOLIC PANEL
ALK PHOS: 40 U/L (ref 39–117)
ALT: 33 U/L (ref 0–35)
AST: 57 U/L — AB (ref 0–37)
Albumin: 2.8 g/dL — ABNORMAL LOW (ref 3.5–5.2)
BILIRUBIN TOTAL: 0.5 mg/dL (ref 0.3–1.2)
BUN: 8 mg/dL (ref 6–23)
CHLORIDE: 112 meq/L (ref 96–112)
CO2: 15 meq/L — AB (ref 19–32)
Calcium: 7.4 mg/dL — ABNORMAL LOW (ref 8.4–10.5)
Creatinine, Ser: 0.66 mg/dL (ref 0.50–1.10)
Glucose, Bld: 86 mg/dL (ref 70–99)
POTASSIUM: 3.8 meq/L (ref 3.7–5.3)
SODIUM: 139 meq/L (ref 137–147)
Total Protein: 5.6 g/dL — ABNORMAL LOW (ref 6.0–8.3)

## 2013-07-23 LAB — URINE CULTURE
Colony Count: NO GROWTH
Culture: NO GROWTH

## 2013-07-23 LAB — SEDIMENTATION RATE
Sed Rate: 5 mm/hr (ref 0–22)
Sed Rate: 18 mm/hr (ref 0–22)

## 2013-07-23 LAB — PROTIME-INR
INR: 1.16 (ref 0.00–1.49)
Prothrombin Time: 14.6 seconds (ref 11.6–15.2)

## 2013-07-23 LAB — C-REACTIVE PROTEIN: CRP: 3.2 mg/dL — ABNORMAL HIGH (ref <0.60)

## 2013-07-23 LAB — BASIC METABOLIC PANEL
BUN: 6 mg/dL (ref 6–23)
CALCIUM: 7.6 mg/dL — AB (ref 8.4–10.5)
CO2: 16 mEq/L — ABNORMAL LOW (ref 19–32)
Chloride: 109 mEq/L (ref 96–112)
Creatinine, Ser: 0.74 mg/dL (ref 0.50–1.10)
GLUCOSE: 78 mg/dL (ref 70–99)
Potassium: 3.9 mEq/L (ref 3.7–5.3)
SODIUM: 134 meq/L — AB (ref 137–147)

## 2013-07-23 LAB — LACTIC ACID, PLASMA: Lactic Acid, Venous: 0.8 mmol/L (ref 0.5–2.2)

## 2013-07-23 LAB — LIPASE, BLOOD: Lipase: 11 U/L (ref 11–59)

## 2013-07-23 MED ORDER — HYDROXYZINE HCL 25 MG PO TABS
25.0000 mg | ORAL_TABLET | Freq: Three times a day (TID) | ORAL | Status: DC
  Administered 2013-07-23 – 2013-07-26 (×9): 25 mg via ORAL
  Filled 2013-07-23 (×19): qty 1

## 2013-07-23 MED ORDER — ENSURE COMPLETE PO LIQD
237.0000 mL | Freq: Two times a day (BID) | ORAL | Status: DC
  Administered 2013-07-23 – 2013-07-24 (×3): 237 mL via ORAL

## 2013-07-23 MED ORDER — SODIUM CHLORIDE 0.9 % IV SOLN
INTRAVENOUS | Status: DC
  Administered 2013-07-23 – 2013-07-28 (×9): via INTRAVENOUS

## 2013-07-23 MED ORDER — VANCOMYCIN HCL IN DEXTROSE 750-5 MG/150ML-% IV SOLN
750.0000 mg | Freq: Three times a day (TID) | INTRAVENOUS | Status: DC
  Administered 2013-07-23 – 2013-07-24 (×3): 750 mg via INTRAVENOUS
  Filled 2013-07-23 (×5): qty 150

## 2013-07-23 MED ORDER — HYDROCODONE-ACETAMINOPHEN 5-325 MG PO TABS
1.0000 | ORAL_TABLET | Freq: Four times a day (QID) | ORAL | Status: DC | PRN
  Administered 2013-07-23: 2 via ORAL
  Administered 2013-07-23 – 2013-07-27 (×7): 1 via ORAL
  Filled 2013-07-23 (×6): qty 1
  Filled 2013-07-23: qty 2
  Filled 2013-07-23: qty 1

## 2013-07-23 MED ORDER — DIPHENHYDRAMINE HCL 25 MG PO CAPS
25.0000 mg | ORAL_CAPSULE | Freq: Four times a day (QID) | ORAL | Status: DC | PRN
  Administered 2013-07-23 – 2013-07-24 (×5): 25 mg via ORAL
  Filled 2013-07-23 (×5): qty 1

## 2013-07-23 MED ORDER — ENOXAPARIN SODIUM 40 MG/0.4ML ~~LOC~~ SOLN
40.0000 mg | Freq: Every day | SUBCUTANEOUS | Status: DC
  Administered 2013-07-23: 40 mg via SUBCUTANEOUS
  Filled 2013-07-23: qty 0.4

## 2013-07-23 MED ORDER — SODIUM BICARBONATE 8.4 % IV SOLN
Freq: Once | INTRAVENOUS | Status: AC
  Administered 2013-07-23: 22:00:00 via INTRAVENOUS
  Filled 2013-07-23: qty 850

## 2013-07-23 MED ORDER — PIPERACILLIN-TAZOBACTAM 3.375 G IVPB
3.3750 g | Freq: Three times a day (TID) | INTRAVENOUS | Status: DC
  Administered 2013-07-24 (×3): 3.375 g via INTRAVENOUS
  Filled 2013-07-23 (×5): qty 50

## 2013-07-23 MED ORDER — PROMETHAZINE HCL 25 MG PO TABS
12.5000 mg | ORAL_TABLET | Freq: Four times a day (QID) | ORAL | Status: DC | PRN
  Administered 2013-07-23 – 2013-07-25 (×5): 12.5 mg via ORAL
  Filled 2013-07-23 (×5): qty 1

## 2013-07-23 MED ORDER — FAMOTIDINE IN NACL 20-0.9 MG/50ML-% IV SOLN
20.0000 mg | Freq: Two times a day (BID) | INTRAVENOUS | Status: DC
  Administered 2013-07-23 – 2013-07-24 (×4): 20 mg via INTRAVENOUS
  Filled 2013-07-23 (×5): qty 50

## 2013-07-23 MED ORDER — IBUPROFEN 800 MG PO TABS
800.0000 mg | ORAL_TABLET | Freq: Once | ORAL | Status: AC
  Administered 2013-07-23: 800 mg via ORAL
  Filled 2013-07-23: qty 1

## 2013-07-23 MED ORDER — SODIUM CHLORIDE 0.9 % IJ SOLN
3.0000 mL | Freq: Two times a day (BID) | INTRAMUSCULAR | Status: DC
  Administered 2013-07-23 – 2013-07-28 (×7): 3 mL via INTRAVENOUS

## 2013-07-23 MED ORDER — BUTAMBEN-TETRACAINE-BENZOCAINE 2-2-14 % EX AERO
1.0000 | INHALATION_SPRAY | Freq: Three times a day (TID) | CUTANEOUS | Status: DC | PRN
  Administered 2013-07-23: 1 via TOPICAL
  Filled 2013-07-23: qty 56

## 2013-07-23 MED ORDER — ENOXAPARIN SODIUM 60 MG/0.6ML ~~LOC~~ SOLN
55.0000 mg | Freq: Two times a day (BID) | SUBCUTANEOUS | Status: DC
  Administered 2013-07-23 – 2013-07-24 (×2): 55 mg via SUBCUTANEOUS
  Filled 2013-07-23 (×4): qty 0.6

## 2013-07-23 NOTE — Progress Notes (Signed)
ANTICOAGULATION CONSULT NOTE - Initial Consult  Pharmacy Consult for Lovenox Indication: left atrial thrombus  Allergies  Allergen Reactions  . Zofran [Ondansetron Hcl]     Redness of face    Patient Measurements: Height: 5\' 2"  (157.5 cm) Weight: 123 lb 0.3 oz (55.8 kg) IBW/kg (Calculated) : 50.1  Vital Signs: Temp: 102.9 F (39.4 C) (04/21 1945) Temp src: Oral (04/21 1945) BP: 138/81 mmHg (04/21 1945) Pulse Rate: 97 (04/21 1945)  Labs:  Recent Labs  07/22/13 1648 07/23/13 0339  HGB 12.3 9.7*  HCT 36.1 29.1*  PLT 189 166  LABPROT  --  14.6  INR  --  1.16  CREATININE 0.98 0.66    Estimated Creatinine Clearance: 84.3 ml/min (by C-G formula based on Cr of 0.66).   Medical History: Past Medical History  Diagnosis Date  . Asthma   . Migraine   . Fibroid, uterine     small intramural  . Tobacco abuse   . Anemia     during pregnancy    Assessment: 28 year old beginning full dose anticoagulation for possible left atrial vs infective thrombus.  Goal of Therapy:  Anti-Xa level 0.6-1 units/ml 4hrs after LMWH dose given Monitor platelets by anticoagulation protocol: Yes   Plan:  1) Lovenox 55 mg sq Q 12 hours 2) Continue to follow Scr, CBC  Thank you. Anette Guarneri, PharmD [phone]  07/23/2013,7:50 PM

## 2013-07-23 NOTE — Progress Notes (Signed)
INITIAL NUTRITION ASSESSMENT  DOCUMENTATION CODES Per approved criteria  -Not Applicable   INTERVENTION: Ensure Complete po BID, each supplement provides 350 kcal and 13 grams of protein RD to follow for nutrition care plan  NUTRITION DIAGNOSIS: Inadequate oral intake related to poor appetite as evidenced by patient report  Goal: Pt to meet >/= 90% of their estimated nutrition needs   Monitor:  PO & supplemental intake, weight, labs, I/O's  Reason for Assessment: Malnutrition Screening Tool Report  28 y.o. female  Admitting Dx: Hypotension  ASSESSMENT: Patient with PMH of chronic abdominal pain, smoking and migraines; presented with complaints of fever and generalized pain; recently had been seen in ED for skin abscess on gluteal region.  CT has evidence of ovarian cyst rupture or recent ovulation.  RD briefly spoke with patient at bedside; not very responsive to RD questions; is able to report her appetite is poor; states she's lost weight, however unable to quantify; per H&P, has been experiencing nausea and vomiting x 2 days PTA; no % PO intake records available; would benefit from addition of nutrition supplement; RD to order.  RD unable to complete Nutrition Focused Physical Exam at this time.  Height: Ht Readings from Last 1 Encounters:  07/22/13 5\' 2"  (1.575 m)    Weight: Wt Readings from Last 1 Encounters:  07/23/13 123 lb 0.3 oz (55.8 kg)    Ideal Body Weight: 110 lb  % Ideal Body Weight: 111%  Wt Readings from Last 10 Encounters:  07/23/13 123 lb 0.3 oz (55.8 kg)  05/08/13 113 lb (51.256 kg)  12/10/12 114 lb (51.71 kg)  10/17/12 117 lb 2 oz (53.128 kg)  10/03/12 118 lb 11.2 oz (53.842 kg)  07/04/12 115 lb (52.164 kg)    Usual Body Weight: 113 lb  % Usual Body Weight: 109%  BMI:  Body mass index is 22.49 kg/(m^2).  Estimated Nutritional Needs: Kcal: 1500-1700 Protein: 70-80 gm Fluid: 1.5-1.7 L  Skin: Intact  Diet Order:  General  EDUCATION NEEDS: -No education needs identified at this time   Intake/Output Summary (Last 24 hours) at 07/23/13 1420 Last data filed at 07/23/13 0600  Gross per 24 hour  Intake 3189.58 ml  Output    450 ml  Net 2739.58 ml    Labs:   Recent Labs Lab 07/22/13 1648 07/23/13 0339  NA 135* 139  K 3.8 3.8  CL 100 112  CO2 18* 15*  BUN 11 8  CREATININE 0.98 0.66  CALCIUM 8.6 7.4*  GLUCOSE 90 86    CBG (last 3)   Recent Labs  07/22/13 1956  GLUCAP 85    Scheduled Meds: . enoxaparin (LOVENOX) injection  40 mg Subcutaneous Daily  . famotidine (PEPCID) IV  20 mg Intravenous Q12H  . sodium chloride  3 mL Intravenous Q12H    Continuous Infusions: . sodium chloride 125 mL/hr at 07/23/13 3151    Past Medical History  Diagnosis Date  . Asthma   . Migraine   . Fibroid, uterine     small intramural  . Tobacco abuse   . Anemia     during pregnancy    Past Surgical History  Procedure Laterality Date  . Cesarean section      x 2  . Tubal ligation      Arthur Holms, RD, LDN Pager #: [phone] After-Hours Pager #: [phone]

## 2013-07-23 NOTE — Progress Notes (Signed)
[place] TEAM 1 - Stepdown/ICU TEAM Progress Note  Madison Copeland EPP:[id number] DOB: [birth year] DOA: 07/22/2013 PCP: No PCP Per Patient  Admit HPI / Brief Narrative: Madison Copeland is a 28 y.o.BF PMHx  chronic abdominal pain, dysmenorrhea, fibroid, smoker, migraine.  Presented with complaints of fever and generalized pain. Patient recently has been seen in the ED earlier for skin abscess on her gluteal region. This was drained and she was placed on oral antibiotics as an outpatient Bactrim and Keflex. She returned 2 days later with persistent generalized weakness fatigue tiredness and fever with symptoms of flu. Workup was negative and her incision was looking healing well.  Today she comes back because her weakness has progressed she has generalized body ache and also had fever.  She continues to complain of nausea and vomiting that is ongoing since more than 2 weeks. She complains of some acid reflux and diffuse abdominal pain. She also complains of pain in her back. She denies any diarrhea or constipation or active bleeding denies any blood in the urine denies any burning when she urinates.  Patient appears poor historian as she is not able to remember her date of LMP, she is unable to remember her medications and is able to provide exact time lines and chronological events of the symptoms.  The patient is coming from home. And at her baseline Independent for most of her ADL.   HPI/Subjective: 4/21 continued to complain of vague allover body aches, with sore throat. Positive fatigue, negative CP/SOB. Positive N., negative V./D., positive Pruritus all over. States has not traveled however works at [employer] and is exposed to numerous visitors.    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 -Anion gap= 12  -ABG pending -Calculate urine anion gap -After all labs collected will start patient on sodium bicarbonate 150 meq x1  Endocrine - TSH pending  -Will obtain a.m. 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 after cardiac MRI obtained  Ovarian cyst rupture  -Possible contributing cause  abdominal pain and generalized rise in temperature with generalized fatigue. But hypotension from ruptured ovarian cyst unlikely T  Fever generalized fatigue joint aches  -ANA pending -CRP elevated at 3.2 -ESR pending -Anti-RNP pending -Rheumatoid factor pending   Reaction to Zofran  -Patient was given IV Zofran initially after which she developed redness of her face and hives on her chest which is resolved with Benadryl. (Allergic reaction?) -Hydroxyzine 25 mg TID -Benadryl when necessary    Code Status: Full  Family Communication: Mother was present at bedside, opportunity was given to ask question and all questions were answered satisfactorily at the time of interview.  Disposition: Resolution sepsis .    Consultants:   Procedure/Significant Events: Echocardiogram 07/23/2013 - LVEF= 65% to  70%.  -echodensity in the lateral left atrial wall in the proximity to the  posterior leaflet of the mitral valve measuring 12 x 11 mm. The diiferential includes left atrial wall or possible mass like myxoma.     PCXR 07/22/2013 No active disease  CT abdomen pelvis with contrast 07/22/2013 -Probable recent rupture of right ovarian cyst versus recent right sided ovulation.  -Moderate amount of free fluid in the cul-de-sac of Douglas and surrounding the right adnexa.    Culture 4/20 Blood cultures pending  Antibiotics: Zosyn 4/21>> Vancomycin 4/21>>  DVT prophylaxis: Lovenox full dose   Devices    LINES / TUBES:  4/20 20 Ga right antecubital 4/20 20 Ga left antecubital    Continuous Infusions: . sodium chloride 125 mL/hr at 07/23/13 0646    Objective: VITAL SIGNS: Temp: 97.6 F (36.4 C) (04/21 0758) Temp src: Oral (04/21 0758) BP: 106/61 mmHg (04/21 0758) Pulse Rate: 79 (04/21 0758) FIO2:   Intake/Output Summary (Last 24 hours) at 07/23/13 0813 Last data filed at 07/23/13 0600  Gross per 24 hour  Intake 3189.58 ml  Output    450 ml  Net 2739.58 ml     Exam: General: A./O. x4, patient looks extremely uncomfortable, lethargic ENT; positive cervical lymphadenopathy Rt >Lt painful to palpation, negative throat erythema Lungs: Clear to auscultation bilaterally without wheezes or crackles Cardiovascular: Regular rate and rhythm without murmur gallop or rub normal S1 and S2 Abdomen: tender LLQ> LUQ, nondistended, soft, bowel sounds positive, no rebound, no ascites, no appreciable mass Extremities: No significant cyanosis, clubbing, or edema bilateral lower extremities  Data Reviewed: Basic Metabolic Panel:  Recent Labs Lab 07/22/13 1648 07/23/13 0339  NA 135* 139  K 3.8 3.8  CL 100 112  CO2 18* 15*  GLUCOSE 90 86  BUN 11 8  CREATININE 0.98 0.66  CALCIUM 8.6 7.4*   Liver Function Tests:  Recent Labs Lab 07/22/13 1648 07/23/13 0339  AST 59* 57*  ALT 35 33  ALKPHOS 54 40  BILITOT 0.5 0.5  PROT 7.5 5.6*  ALBUMIN  3.7 2.8*   No results found for this basename: LIPASE, AMYLASE,  in the last 168 hours No results found for this basename: AMMONIA,  in the last 168 hours CBC:  Recent Labs Lab 07/22/13 1648 07/23/13 0339  WBC 4.3 3.6*  NEUTROABS 3.4  --   HGB 12.3 9.7*  HCT 36.1 29.1*  MCV 95.0 95.4  PLT 189 166   Cardiac Enzymes: No results found for this basename: CKTOTAL, CKMB, CKMBINDEX, TROPONINI,  in the last 168 hours BNP (last 3 results) No results found for this basename: PROBNP,  in the last 8760 hours CBG:  Recent Labs Lab 07/22/13 1956  GLUCAP 85    Recent Results (from the past 240 hour(s))  MRSA PCR SCREENING     Status: None   Collection Time    07/23/13 12:00 AM      Result Value Ref Range Status   MRSA by PCR NEGATIVE  NEGATIVE Final   Comment:            The GeneXpert MRSA Assay (FDA     approved for NASAL specimens     only), is one component of a     comprehensive MRSA colonization     surveillance program. It is not     intended to diagnose MRSA     infection nor to guide or     monitor treatment for     MRSA infections.  Studies:  Recent x-ray studies have been reviewed in detail by the Attending Physician  Scheduled Meds:  Scheduled Meds: . enoxaparin (LOVENOX) injection  40 mg Subcutaneous Daily  . famotidine (PEPCID) IV  20 mg Intravenous Q12H  . sodium chloride  3 mL Intravenous Q12H    Time spent on care of this patient: 100 mins   Allie Bossier , MD   [facility] Office  [phone] Pager [phone]  On-Call/Text Page:      [email or web address]      password [email or web address]  If 7PM-7AM, please contact night-coverage [email or web address] Password [facility] 07/23/2013, 8:13 AM   LOS: 1 day

## 2013-07-23 NOTE — Progress Notes (Signed)
*  PRELIMINARY RESULTS* Echocardiogram 2D Echocardiogram has been performed.  Madison Copeland 07/23/2013, 10:44 AM

## 2013-07-24 ENCOUNTER — Inpatient Hospital Stay (HOSPITAL_COMMUNITY): Payer: Medicaid Other

## 2013-07-24 DIAGNOSIS — A419 Sepsis, unspecified organism: Secondary | ICD-10-CM

## 2013-07-24 DIAGNOSIS — R9389 Abnormal findings on diagnostic imaging of other specified body structures: Secondary | ICD-10-CM

## 2013-07-24 LAB — CBC WITH DIFFERENTIAL/PLATELET
BASOS ABS: 0 10*3/uL (ref 0.0–0.1)
Basophils Relative: 0 % (ref 0–1)
EOS ABS: 0.2 10*3/uL (ref 0.0–0.7)
EOS PCT: 3 % (ref 0–5)
HCT: 28.7 % — ABNORMAL LOW (ref 36.0–46.0)
Hemoglobin: 9.6 g/dL — ABNORMAL LOW (ref 12.0–15.0)
LYMPHS PCT: 20 % (ref 12–46)
Lymphs Abs: 1.3 10*3/uL (ref 0.7–4.0)
MCH: 31.6 pg (ref 26.0–34.0)
MCHC: 33.4 g/dL (ref 30.0–36.0)
MCV: 94.4 fL (ref 78.0–100.0)
Monocytes Absolute: 0.2 10*3/uL (ref 0.1–1.0)
Monocytes Relative: 3 % (ref 3–12)
Neutro Abs: 5 10*3/uL (ref 1.7–7.7)
Neutrophils Relative %: 74 % (ref 43–77)
PLATELETS: 136 10*3/uL — AB (ref 150–400)
RBC: 3.04 MIL/uL — ABNORMAL LOW (ref 3.87–5.11)
RDW: 12.7 % (ref 11.5–15.5)
WBC: 6.7 10*3/uL (ref 4.0–10.5)

## 2013-07-24 LAB — COMPREHENSIVE METABOLIC PANEL
ALK PHOS: 45 U/L (ref 39–117)
ALT: 43 U/L — AB (ref 0–35)
AST: 70 U/L — ABNORMAL HIGH (ref 0–37)
Albumin: 2.9 g/dL — ABNORMAL LOW (ref 3.5–5.2)
BILIRUBIN TOTAL: 0.7 mg/dL (ref 0.3–1.2)
BUN: 5 mg/dL — AB (ref 6–23)
CO2: 21 mEq/L (ref 19–32)
Calcium: 7.4 mg/dL — ABNORMAL LOW (ref 8.4–10.5)
Chloride: 105 mEq/L (ref 96–112)
Creatinine, Ser: 0.63 mg/dL (ref 0.50–1.10)
GFR calc Af Amer: >90 mL/min (ref >90)
GFR calc non Af Amer: >90 mL/min (ref >90)
GLUCOSE: 82 mg/dL (ref 70–99)
POTASSIUM: 3.9 meq/L (ref 3.7–5.3)
Sodium: 138 mEq/L (ref 137–147)
Total Protein: 5.8 g/dL — ABNORMAL LOW (ref 6.0–8.3)

## 2013-07-24 LAB — URINALYSIS, ROUTINE W REFLEX MICROSCOPIC
BILIRUBIN URINE: NEGATIVE
Glucose, UA: NEGATIVE mg/dL
Ketones, ur: 15 mg/dL — AB
Leukocytes, UA: NEGATIVE
Nitrite: NEGATIVE
Protein, ur: NEGATIVE mg/dL
Specific Gravity, Urine: 1.006 (ref 1.005–1.030)
Urobilinogen, UA: 1 mg/dL (ref 0.0–1.0)
pH: 7 (ref 5.0–8.0)

## 2013-07-24 LAB — RESPIRATORY VIRUS PANEL
ADENOVIRUS: NOT DETECTED
Influenza A H1: NOT DETECTED
Influenza A H3: NOT DETECTED
Influenza A: NOT DETECTED
Influenza B: NOT DETECTED
Metapneumovirus: NOT DETECTED
PARAINFLUENZA 2 A: NOT DETECTED
Parainfluenza 1: NOT DETECTED
Parainfluenza 3: NOT DETECTED
RHINOVIRUS: NOT DETECTED
Respiratory Syncytial Virus A: NOT DETECTED
Respiratory Syncytial Virus B: NOT DETECTED

## 2013-07-24 LAB — RHEUMATOID FACTOR: Rhuematoid fact SerPl-aCnc: <10 IU/mL (ref <=14)

## 2013-07-24 LAB — RAPID URINE DRUG SCREEN, HOSP PERFORMED
AMPHETAMINES: NOT DETECTED
Barbiturates: NOT DETECTED
Benzodiazepines: NOT DETECTED
Cocaine: NOT DETECTED
Opiates: NOT DETECTED
TETRAHYDROCANNABINOL: NOT DETECTED

## 2013-07-24 LAB — INFLUENZA PANEL BY PCR (TYPE A & B)
H1N1 flu by pcr: NOT DETECTED
Influenza A By PCR: NEGATIVE
Influenza B By PCR: NEGATIVE

## 2013-07-24 LAB — CORTISOL-AM, BLOOD: CORTISOL - AM: 14 ug/dL (ref 4.3–22.4)

## 2013-07-24 LAB — LACTIC ACID, PLASMA: Lactic Acid, Venous: 1.1 mmol/L (ref 0.5–2.2)

## 2013-07-24 LAB — ANA: Anti Nuclear Antibody(ANA): NEGATIVE

## 2013-07-24 LAB — URINE MICROSCOPIC-ADD ON

## 2013-07-24 LAB — TSH: TSH: 2.33 u[IU]/mL (ref 0.350–4.500)

## 2013-07-24 LAB — NA AND K (SODIUM & POTASSIUM), RAND UR
Potassium Urine: 8 mEq/L
Sodium, Ur: 42 mEq/L

## 2013-07-24 LAB — GC/CHLAMYDIA PROBE AMP
CT Probe RNA: NEGATIVE
GC Probe RNA: NEGATIVE

## 2013-07-24 LAB — HIV ANTIBODY (ROUTINE TESTING W REFLEX): HIV 1&2 Ab, 4th Generation: NONREACTIVE

## 2013-07-24 LAB — ANTI-RIBONUCLEIC ACID ANTIBODY: Sm/rnp: <1

## 2013-07-24 LAB — TROPONIN I

## 2013-07-24 LAB — RPR

## 2013-07-24 LAB — CHLORIDE, URINE, RANDOM: Chloride Urine: 35 mEq/L

## 2013-07-24 MED ORDER — LEVOFLOXACIN IN D5W 750 MG/150ML IV SOLN
750.0000 mg | INTRAVENOUS | Status: DC
  Administered 2013-07-24 – 2013-07-28 (×5): 750 mg via INTRAVENOUS
  Filled 2013-07-24 (×7): qty 150

## 2013-07-24 MED ORDER — SODIUM CHLORIDE 0.9 % IV BOLUS (SEPSIS)
500.0000 mL | Freq: Once | INTRAVENOUS | Status: AC
  Administered 2013-07-24: 500 mL via INTRAVENOUS

## 2013-07-24 MED ORDER — ENOXAPARIN SODIUM 40 MG/0.4ML ~~LOC~~ SOLN
40.0000 mg | SUBCUTANEOUS | Status: DC
  Administered 2013-07-24 – 2013-07-28 (×5): 40 mg via SUBCUTANEOUS
  Filled 2013-07-24 (×6): qty 0.4

## 2013-07-24 MED ORDER — GADOBENATE DIMEGLUMINE 529 MG/ML IV SOLN
18.0000 mL | Freq: Once | INTRAVENOUS | Status: AC | PRN
  Administered 2013-07-24: 18 mL via INTRAVENOUS

## 2013-07-24 MED ORDER — ALBUTEROL SULFATE (2.5 MG/3ML) 0.083% IN NEBU
2.5000 mg | INHALATION_SOLUTION | RESPIRATORY_TRACT | Status: DC | PRN
  Administered 2013-07-24 – 2013-07-25 (×2): 2.5 mg via RESPIRATORY_TRACT
  Filled 2013-07-24 (×2): qty 3

## 2013-07-24 MED ORDER — IBUPROFEN 800 MG PO TABS
800.0000 mg | ORAL_TABLET | Freq: Once | ORAL | Status: AC
  Administered 2013-07-24: 800 mg via ORAL
  Filled 2013-07-24: qty 1

## 2013-07-24 NOTE — Progress Notes (Addendum)
Pt. Temp 102.3 F. Paged MD for ibuprofen because the ibuprofen worked best on pt. Temp. Ice packs and cool wash cloths applied to help lower temp. MD called back ordered Ibuprofen and a 579ml bolus. Will continue to monitor  Temp now 100.2 F. MD made aware. Will continue to monitor.

## 2013-07-24 NOTE — Progress Notes (Signed)
Patient complains of difficulty breathing and states "it feels like I'm having an asthma attack".  No audible wheezing at this time. She states that she has a prescription for an Albuterol inhaler at home that she rarely uses.  MD notified.  Will continue to monitor.

## 2013-07-24 NOTE — Progress Notes (Addendum)
Pt complains of itching.  When assessed, patient's right arm, above IV site, was covered in a red rash.  Famotidine (pepcid) had been given IV less than an hour prior.  Benadryl administered for itching.  RN researched history of Benadryl administration in relation to medications given.  It appears that the other complaints of itching occurred within an hour of Famotidine administration.  MD made aware.  Patient remains stable at this time.  Will continue to monitor.

## 2013-07-24 NOTE — Progress Notes (Signed)
[facility] TEAM 1 - Stepdown/ICU TEAM Progress Note  Beverlee Wilmarth BZJ:[id number] DOB: 03-24-1986 DOA: 07/22/2013 PCP: No PCP Per Patient  Admit HPI / Brief Narrative: 28 y.o.F w/ a Hx of chronic abdominal pain, dysmenorrhea, fibroids, tobacco abuse, and migraine who presented with complaints of fever and generalized pain. Patient had been seen in the ED earlier for skin abscess on her gluteal region. This was drained and she was placed on oral antibiotics as an outpatient (Bactrim and Keflex). She returned 2 days later with persistent generalized weakness fatigue tiredness and fever. Workup was negative and her incision was reportedly healing well.   4/20 she came back to the ER because her weakness had progressed.  She reported generalized body ache and also fever. Patient was noted to be a poor historian as she was not able to remember her date of LMP, her medications, and was unable to provide exact time lines and chronological events of the symptoms.   HPI/Subjective: The patient complains of intermittent wheezing today.  Though she states she feels very tired and aches all over her predominant complaint today is that of dyspnea with associated intermittent wheezing.  Assessment/Plan:  Sepsis / Hypotension  -Respiratory viral panel pending -Influenza panel by PCR negative  -4/20 Blood cultures no growth thus far -No fever for approximately 24 hours now -Urine culture unrevealing and urinalysis unrevealing -Mononucleosis screening negative -GC/Chlamydia urine test negative -HIV rapid screen negative  -RPR negative  -CT abdomen nondiagnostic  -Cardiac MRI reveals normal architecture and no evidence of mass -Patient's BP has stabilized with hydration -cortisol level normal  -Cardiac MRI raises question of parenchymal lung abnormality - question atypical pneumonia - will obtain dedicated CT of chest in a.m. - transition antibiotic to Levaquin alone  Non-anion gap Metabolic  acidosis? 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) -Cardiac MRI reveals no structural abnormalities  Ovarian cyst rupture versus normal ovulation -Suspect CT abdomen findings reflective of normal ovulation as patient has just started her menstrual period this afternoon  Fever generalized fatigue joint aches  -ANA negative  -CRP elevated at 3.2 -ESR normal x2 -Anti-RNP negative  -Rheumatoid factor <10 -suspect atypical PNA as noted above - for CT chest in AM   Mild transaminitis -?mild shock liver due to initial hypotension - follow - eval further if worsens   Reaction to Zofran  -Patient was given IV Zofran initially after which she developed redness of her face and hives on her chest which is resolved with Benadryl. (Allergic reaction?) -Hydroxyzine 25 mg TID -Benadryl when necessary   Code Status: Full  Family Communication: Mother was present at bedside, opportunity was given to ask question and all questions were answered satisfactorily at the time of interview again today  Disposition: SDU - possible transfer to medical bed in AM if BP remains stable   Consultants: None  Procedure/Significant Events: Echocardiogram 07/23/2013 - LVEF= 65% to 70%. - echodensity in the lateral left atrial wall in the proximity to the posterior leaflet of the mitral valve measuring 12 x 11 mm. The diiferential includes left atrial wall or possible mass like myxoma.   PCXR 07/22/2013 No active disease  CT abdomen pelvis with contrast 07/22/2013 -Probable recent rupture of right ovarian cyst versus recent right sided ovulation.  -Moderate amount of free fluid in the cul-de-sac of [place] and surrounding the right adnexa.  Cardiac MRI 4/22 -Significant bilateral airspace disease noted on views of lung fields. Should have clinical  followup and dedicated imaging. -No left atrial mass was noted. The echo finding was likely an enfolding  of the left atrial wall -Normal LV and RV size and systolic function  Antibiotics: Zosyn 4/21>>4/22 Vancomycin 4/21>>4/22 Levaquin 4/22 >>  DVT prophylaxis: Lovenox  Objective: VITAL SIGNS: Temp: 97.8 F (36.6 C) (04/22 1216) Temp src: Oral (04/22 1216) BP: 124/45 mmHg (04/22 1216) Pulse Rate: 76 (04/22 1216) FIO2:  Intake/Output Summary (Last 24 hours) at 07/24/13 1509 Last data filed at 07/24/13 1426  Gross per 24 hour  Intake   3703 ml  Output   1850 ml  Net   1853 ml   Exam: General: alert - no acute resp distress  Lungs: mild diffuse exp wheeze  Cardiovascular: Regular rate and rhythm without murmur gallop or rub  Abdomen: tender LLQ> LUQ, nondistended, soft, bowel sounds positive, no rebound, no ascites, no appreciable mass Extremities: No significant cyanosis, clubbing, or edema bilateral lower extremities  Data Reviewed: Basic Metabolic Panel:  Recent Labs Lab 07/22/13 1648 07/23/13 0339 07/23/13 1905 07/24/13 0310  NA 135* 139 134* 138  K 3.8 3.8 3.9 3.9  CL 100 112 109 105  CO2 18* 15* 16* 21  GLUCOSE 90 86 78 82  BUN '11 8 6 ' 5*  CREATININE 0.98 0.66 0.74 0.63  CALCIUM 8.6 7.4* 7.6* 7.4*   Liver Function Tests:  Recent Labs Lab 07/22/13 1648 07/23/13 0339 07/24/13 0310  AST 59* 57* 70*  ALT 35 33 43*  ALKPHOS 54 40 45  BILITOT 0.5 0.5 0.7  PROT 7.5 5.6* 5.8*  ALBUMIN 3.7 2.8* 2.9*    Recent Labs Lab 07/23/13 1112  LIPASE 11   CBC:  Recent Labs Lab 07/22/13 1648 07/23/13 0339 07/24/13 0310  WBC 4.3 3.6* 6.7  NEUTROABS 3.4  --  5.0  HGB 12.3 9.7* 9.6*  HCT 36.1 29.1* 28.7*  MCV 95.0 95.4 94.4  PLT 189 166 136*   Cardiac Enzymes:  Recent Labs Lab 07/24/13 0310  TROPONINI <0.30   CBG:  Recent Labs Lab 07/22/13 1956  GLUCAP 85    Recent Results (from the past 240 hour(s))  URINE CULTURE     Status: None   Collection Time    07/22/13  4:58 PM      Result Value Ref Range Status   Specimen Description URINE,  CLEAN CATCH   Final   Special Requests NONE   Final   Culture  Setup Time     Final   Value: 07/23/2013 00:57     Performed at [facility]     Final   Value: NO GROWTH     Performed at [facility]   Culture     Final   Value: NO GROWTH     Performed at [facility]   Report Status 07/23/2013 FINAL   Final  CULTURE, BLOOD (ROUTINE X 2)     Status: None   Collection Time    07/22/13  7:58 PM      Result Value Ref Range Status   Specimen Description BLOOD ARM RIGHT   Final   Special Requests BOTTLES DRAWN AEROBIC AND ANAEROBIC 5CC   Final   Culture  Setup Time     Final   Value: 07/23/2013 04:12     Performed at [facility]   Culture     Final   Value:        BLOOD CULTURE RECEIVED NO GROWTH TO DATE CULTURE  WILL BE HELD FOR 5 DAYS BEFORE ISSUING A FINAL NEGATIVE REPORT     Performed at [facility]   Report Status PENDING   Incomplete  CULTURE, BLOOD (ROUTINE X 2)     Status: None   Collection Time    07/22/13  8:10 PM      Result Value Ref Range Status   Specimen Description BLOOD ARM RIGHT   Final   Special Requests BOTTLES DRAWN AEROBIC AND ANAEROBIC 10CC   Final   Culture  Setup Time     Final   Value: 07/23/2013 04:12     Performed at [facility]   Culture     Final   Value:        BLOOD CULTURE RECEIVED NO GROWTH TO DATE CULTURE WILL BE HELD FOR 5 DAYS BEFORE ISSUING A FINAL NEGATIVE REPORT     Performed at [facility]   Report Status PENDING   Incomplete  MRSA PCR SCREENING     Status: None   Collection Time    07/23/13 12:00 AM      Result Value Ref Range Status   MRSA by PCR NEGATIVE  NEGATIVE Final   Comment:            The GeneXpert MRSA Assay (FDA     approved for NASAL specimens     only), is one component of a     comprehensive MRSA colonization     surveillance program. It is not     intended to diagnose MRSA     infection nor to guide or     monitor treatment for     MRSA  infections.  GC/CHLAMYDIA PROBE AMP     Status: None   Collection Time    07/24/13  4:03 AM      Result Value Ref Range Status   CT Probe RNA NEGATIVE  NEGATIVE Final   GC Probe RNA NEGATIVE  NEGATIVE Final   Comment: (NOTE)                                                                                               **Normal Reference Range: Negative**          Assay performed using the Gen-Probe APTIMA COMBO2 (R) Assay.     Acceptable specimen types for this assay include APTIMA Swabs (Unisex,     endocervical, urethral, or vaginal), first void urine, and ThinPrep     liquid based cytology samples.     Performed at [facility]     Studies:  Recent x-ray studies have been reviewed in detail by the Attending Physician  Scheduled Meds:  Scheduled Meds: . enoxaparin (LOVENOX) injection  55 mg Subcutaneous Q12H  . famotidine (PEPCID) IV  20 mg Intravenous Q12H  . feeding supplement (ENSURE COMPLETE)  237 mL Oral BID BM  . hydrOXYzine  25 mg Oral TID  . piperacillin-tazobactam (ZOSYN)  IV  3.375 g Intravenous Q8H  . sodium chloride  3 mL Intravenous Q12H  . vancomycin  750 mg Intravenous Q8H    Time spent on care of  this patient: 53 mins   Cherene Altes , MD   [facility] Office  [phone]  On-Call/Text Page:      [email or web address]      password [email or web address]  If 7PM-7AM, please contact night-coverage [email or web address] Password [email or web address] 07/24/2013, 3:09 PM   LOS: 2 days

## 2013-07-25 ENCOUNTER — Inpatient Hospital Stay (HOSPITAL_COMMUNITY): Payer: Medicaid Other

## 2013-07-25 DIAGNOSIS — R0902 Hypoxemia: Secondary | ICD-10-CM

## 2013-07-25 DIAGNOSIS — E872 Acidosis, unspecified: Secondary | ICD-10-CM

## 2013-07-25 DIAGNOSIS — R918 Other nonspecific abnormal finding of lung field: Secondary | ICD-10-CM | POA: Insufficient documentation

## 2013-07-25 DIAGNOSIS — I5189 Other ill-defined heart diseases: Secondary | ICD-10-CM

## 2013-07-25 DIAGNOSIS — J189 Pneumonia, unspecified organism: Secondary | ICD-10-CM | POA: Insufficient documentation

## 2013-07-25 LAB — COMPREHENSIVE METABOLIC PANEL
ALBUMIN: 2.9 g/dL — AB (ref 3.5–5.2)
ALK PHOS: 50 U/L (ref 39–117)
ALT: 46 U/L — AB (ref 0–35)
AST: 58 U/L — AB (ref 0–37)
BILIRUBIN TOTAL: 0.9 mg/dL (ref 0.3–1.2)
BUN: 3 mg/dL — AB (ref 6–23)
CHLORIDE: 103 meq/L (ref 96–112)
CO2: 20 mEq/L (ref 19–32)
Calcium: 7.9 mg/dL — ABNORMAL LOW (ref 8.4–10.5)
Creatinine, Ser: 0.58 mg/dL (ref 0.50–1.10)
GFR calc Af Amer: >90 mL/min (ref >90)
GFR calc non Af Amer: >90 mL/min (ref >90)
Glucose, Bld: 89 mg/dL (ref 70–99)
POTASSIUM: 4 meq/L (ref 3.7–5.3)
SODIUM: 138 meq/L (ref 137–147)
TOTAL PROTEIN: 6.1 g/dL (ref 6.0–8.3)

## 2013-07-25 LAB — CBC
HEMATOCRIT: 27.8 % — AB (ref 36.0–46.0)
Hemoglobin: 9.7 g/dL — ABNORMAL LOW (ref 12.0–15.0)
MCH: 32.6 pg (ref 26.0–34.0)
MCHC: 34.9 g/dL (ref 30.0–36.0)
MCV: 93.3 fL (ref 78.0–100.0)
Platelets: 140 10*3/uL — ABNORMAL LOW (ref 150–400)
RBC: 2.98 MIL/uL — ABNORMAL LOW (ref 3.87–5.11)
RDW: 12.2 % (ref 11.5–15.5)
WBC: 6.7 10*3/uL (ref 4.0–10.5)

## 2013-07-25 LAB — HEPATITIS PANEL, ACUTE
HCV Ab: NEGATIVE
HEP A IGM: NONREACTIVE
Hep B C IgM: NONREACTIVE
Hepatitis B Surface Ag: NEGATIVE

## 2013-07-25 MED ORDER — METHYLPREDNISOLONE SODIUM SUCC 125 MG IJ SOLR
125.0000 mg | Freq: Three times a day (TID) | INTRAMUSCULAR | Status: DC
  Administered 2013-07-25 – 2013-07-26 (×4): 125 mg via INTRAVENOUS
  Filled 2013-07-25 (×6): qty 2

## 2013-07-25 MED ORDER — IOHEXOL 300 MG/ML  SOLN
75.0000 mL | Freq: Once | INTRAMUSCULAR | Status: AC | PRN
Start: 2013-07-25 — End: 2013-07-25
  Administered 2013-07-25: 75 mL via INTRAVENOUS

## 2013-07-25 NOTE — Progress Notes (Signed)
[facility] TEAM 1 - Stepdown/ICU TEAM Progress Note  Madison Copeland LSL:[id number] DOB: 01-07-1986 DOA: 07/22/2013 PCP: No PCP Per Patient  Admit HPI / Brief Narrative: Madison Copeland is a 28 y.o.BF PMHx  chronic abdominal pain, dysmenorrhea, fibroid, smoker, migraine.  Presented with complaints of fever and generalized pain. Patient recently has been seen in the ED earlier for skin abscess on her gluteal region. This was drained and she was placed on oral antibiotics as an outpatient Bactrim and Keflex. She returned 2 days later with persistent generalized weakness fatigue tiredness and fever with symptoms of flu. Workup was negative and her incision was looking healing well.  Today she comes back because her weakness has progressed she has generalized body ache and also had fever.  She continues to complain of nausea and vomiting that is ongoing since more than 2 weeks. She complains of some acid reflux and diffuse abdominal pain. She also complains of pain in her back. She denies any diarrhea or constipation or active bleeding denies any blood in the urine denies any burning when she urinates.  Patient appears poor historian as she is not able to remember her date of LMP, she is unable to remember her medications and is able to provide exact time lines and chronological events of the symptoms.  The patient is coming from home. And at her baseline Independent for most of her ADL.   HPI/Subjective: 4/23 continued to complain of vague allover body aches, with sore throat. Positive fatigue, negative CP/SOB. Positive N/ with vomiting overnight    Assessment/Plan:  Sepsis/hypotension  -Respiratory viral panel pending -Influenza panel by PCR pending -4/20, 4/21, Blood cultures negative -Patient has continues to spike fever will repeat blood cultures -Urine culture pending -Mononucleosis screening negative -Abnormal urinalysis however not definitive for infection will repeat -GC/Chlamydia urine  test pending -HIV rapid screen negative  -RPR negative -CT abdomen nondiagnostic see results below - 4/21 Echocardiogram; left atrial mass ( Myxoma vs thrombus vs infected thrombus);    Obtain cardiac MRI  -Patient's BP has stabilized with continued hydration  COP/DIP/ITP? -Patient negative for infectious source/rheumatologic cause -Patient has been unresponsive to antibiotics and CT chest consistent with COP -Consulted PCCM.,Dr.  Jennet Maduro for possible BAL   Anion gap Metabolic acidosis? -Resolved  -4/23 Anion gap= 15    Endocrine - TSH within normal limit   -Am. 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 But hypotension from ruptured ovarian cyst unlikely   Fever generalized fatigue joint aches  -ANA negative  -CRP elevated at 3.2 -ESR normal -Anti-RNP negative  -Rheumatoid factor negative   Reaction to Zofran  -Patient was given IV Zofran initially after which she developed redness of her face and hives on her chest which is resolved with Benadryl. (Allergic reaction?) -Hydroxyzine 25 mg TID -Benadryl when necessary    Code Status: Full  Family Communication: Mother was present at bedside, opportunity was given to ask question and all questions were answered satisfactorily at the time of interview.  Disposition: Resolution sepsis .  Consultants: Dr.  Jennet Maduro (PCCM)   Procedure/Significant Events:  CT chest with contrast  07/25/2013 -Widespread alveolar opacities bilaterally; involvement of all lobes in segments to a varying degree w/ alveolar opacity; findings primarily in a perihilar distribution. - moderate pleural effusions bilaterally.     Cardiac MRI 07/23/2013 -Significant bilateral airspace disease  -No left atrial mass was noted; echo finding enfolding of the left atrial wall.  - Normal LV and RV size and systolic function.   Echocardiogram 07/23/2013 - LVEF= 65% to 70%.  -echodensity in the lateral left atrial wall in the proximity to the posterior leaflet of the mitral valve measuring 12 x 11 mm. The diiferential includes left atrial wall or possible mass like myxoma.    PCXR 07/22/2013 No active disease  CT abdomen pelvis with contrast 07/22/2013 -Probable recent rupture of right ovarian cyst versus recent right sided ovulation.  -Moderate amount of free fluid in the cul-de-sac of Douglas and surrounding the right adnexa.    Culture 4/20 Blood cultures pending  Antibiotics: Zosyn 4/21>> stopped 4/22 Vancomycin 4/21>> stopped 4/20 Levofloxacin 4/22>>   DVT prophylaxis: Lovenox full dose   Devices    LINES / TUBES:  4/20 20 Ga right antecubital 4/20 20 Ga left antecubital    Continuous Infusions: . sodium chloride 100 mL/hr at 07/25/13 0600    Objective: VITAL SIGNS: Temp: 98.9 F (37.2 C) (04/23 0823) Temp src: Oral (04/23 0823) BP: 127/113 mmHg (04/23 0823) Pulse Rate: 81 (04/23 0823) FIO2:   Intake/Output Summary (Last 24 hours) at 07/25/13 1254 Last data filed at 07/25/13 0000  Gross per 24 hour  Intake 2324.58 ml  Output      0 ml  Net 2324.58 ml     Exam: General: A./O. x4, patient looks extremely uncomfortable,  ENT; positive cervical lymphadenopathy Rt >Lt painful to palpation (decreased from previous exam), positive throat erythema, petechia on the soft palate Lungs: Clear to auscultation bilaterally without wheezes or crackles Cardiovascular:  Regular rate and rhythm without murmur gallop or rub normal S1 and S2 Abdomen: tender LLQ> LUQ, nondistended, soft, bowel sounds positive, no rebound, no ascites, no appreciable mass Extremities: No significant cyanosis, clubbing, or edema bilateral lower extremities  Data Reviewed: Basic Metabolic Panel:  Recent Labs Lab 07/22/13 1648 07/23/13 0339 07/23/13 1905 07/24/13 0310 07/25/13 0259  NA 135* 139 134* 138 138  K 3.8 3.8 3.9 3.9 4.0  CL 100 112 109 105 103  CO2 18* 15* 16* 21 20  GLUCOSE 90 86 78 82 89  BUN '11 8 6 ' 5* 3*  CREATININE 0.98 0.66 0.74 0.63 0.58  CALCIUM 8.6 7.4* 7.6* 7.4* 7.9*   Liver Function Tests:  Recent Labs Lab 07/22/13 1648 07/23/13 0339 07/24/13 0310 07/25/13 0259  AST 59* 57* 70* 58*  ALT 35 33 43* 46*  ALKPHOS 54 40 45 50  BILITOT 0.5 0.5 0.7 0.9  PROT 7.5 5.6* 5.8* 6.1  ALBUMIN 3.7 2.8* 2.9* 2.9*    Recent Labs Lab 07/23/13 1112  LIPASE 11   No results found for this basename: AMMONIA,  in the last 168 hours CBC:  Recent Labs Lab 07/22/13 1648 07/23/13 0339 07/24/13 0310 07/25/13 0259  WBC 4.3 3.6* 6.7 6.7  NEUTROABS 3.4  --  5.0  --   HGB 12.3 9.7* 9.6* 9.7*  HCT 36.1 29.1* 28.7* 27.8*  MCV 95.0 95.4 94.4 93.3  PLT 189 166 136* 140*   Cardiac Enzymes:  Recent Labs Lab 07/24/13 0310  TROPONINI <0.30  BNP (last 3 results) No results found for this basename: PROBNP,  in the last 8760 hours CBG:  Recent Labs Lab 07/22/13 1956  GLUCAP 85    Recent Results (from the past 240 hour(s))  URINE CULTURE     Status: None   Collection Time    07/22/13  4:58 PM      Result Value Ref Range Status   Specimen Description URINE, CLEAN CATCH   Final   Special Requests NONE   Final   Culture  Setup Time     Final   Value: 07/23/2013 00:57     Performed at [facility]     Final   Value: NO GROWTH     Performed at [facility]   Culture     Final   Value: NO GROWTH     Performed at  [facility]   Report Status 07/23/2013 FINAL   Final  CULTURE, BLOOD (ROUTINE X 2)     Status: None   Collection Time    07/22/13  7:58 PM      Result Value Ref Range Status   Specimen Description BLOOD ARM RIGHT   Final   Special Requests BOTTLES DRAWN AEROBIC AND ANAEROBIC 5CC   Final   Culture  Setup Time     Final   Value: 07/23/2013 04:12     Performed at [facility]   Culture     Final   Value:        BLOOD CULTURE RECEIVED NO GROWTH TO DATE CULTURE WILL BE HELD FOR 5 DAYS BEFORE ISSUING A FINAL NEGATIVE REPORT     Performed at [facility]   Report Status PENDING   Incomplete  CULTURE, BLOOD (ROUTINE X 2)     Status: None   Collection Time    07/22/13  8:10 PM      Result Value Ref Range Status   Specimen Description BLOOD ARM RIGHT   Final   Special Requests BOTTLES DRAWN AEROBIC AND ANAEROBIC 10CC   Final   Culture  Setup Time     Final   Value: 07/23/2013 04:12     Performed at [facility]   Culture     Final   Value:        BLOOD CULTURE RECEIVED NO GROWTH TO DATE CULTURE WILL BE HELD FOR 5 DAYS BEFORE ISSUING A FINAL NEGATIVE REPORT     Performed at [facility]   Report Status PENDING   Incomplete  MRSA PCR SCREENING     Status: None   Collection Time    07/23/13 12:00 AM      Result Value Ref Range Status   MRSA by PCR NEGATIVE  NEGATIVE Final   Comment:            The GeneXpert MRSA Assay (FDA     approved for NASAL specimens     only), is one component of a     comprehensive MRSA colonization     surveillance program. It is not     intended to diagnose MRSA     infection nor to guide or     monitor treatment for     MRSA infections.  RESPIRATORY VIRUS PANEL     Status: None   Collection Time    07/23/13  8:06 PM      Result Value Ref Range Status   Source - RVPAN NASAL SWAB   Corrected  Comment: CORRECTED ON 04/22 AT 1814: PREVIOUSLY REPORTED AS NASAL SWAB   Respiratory Syncytial Virus A NOT DETECTED   Final     Respiratory Syncytial Virus B NOT DETECTED   Final   Influenza A NOT DETECTED   Final   Influenza B NOT DETECTED   Final   Parainfluenza 1 NOT DETECTED   Final   Parainfluenza 2 NOT DETECTED   Final   Parainfluenza 3 NOT DETECTED   Final   Metapneumovirus NOT DETECTED   Final   Rhinovirus NOT DETECTED   Final   Adenovirus NOT DETECTED   Final   Influenza A H1 NOT DETECTED   Final   Influenza A H3 NOT DETECTED   Final   Comment: (NOTE)           Normal Reference Range for each Analyte: NOT DETECTED     Testing performed using the Luminex xTAG Respiratory Viral Panel test     kit.     This test was developed and its performance characteristics determined     by [facility]. It has not been cleared or approved by the [place]     Food and Drug Administration. This test is used for clinical purposes.     It should not be regarded as investigational or for research. This     laboratory is certified under the [place] (CLIA) as qualified to perform high complexity     clinical laboratory testing.     Performed at [place], BLOOD (ROUTINE X 2)     Status: None   Collection Time    07/23/13  8:55 PM      Result Value Ref Range Status   Specimen Description BLOOD RIGHT HAND   Final   Special Requests     Final   Value: BOTTLES DRAWN AEROBIC AND ANAEROBIC 10CC BLUE,5CC RED   Culture  Setup Time     Final   Value: 07/24/2013 00:16     Performed at [facility]   Culture     Final   Value:        BLOOD CULTURE RECEIVED NO GROWTH TO DATE CULTURE WILL BE HELD FOR 5 DAYS BEFORE ISSUING A FINAL NEGATIVE REPORT     Performed at [facility]   Report Status PENDING   Incomplete  CULTURE, BLOOD (ROUTINE X 2)     Status: None   Collection Time    07/23/13  9:13 PM      Result Value Ref Range Status   Specimen Description BLOOD RIGHT UPPER ARM   Final   Special Requests     Final   Value: BOTTLES DRAWN  AEROBIC AND ANAEROBIC 10CC BLUE,4CC RED   Culture  Setup Time     Final   Value: 07/24/2013 00:28     Performed at [facility]   Culture     Final   Value:        BLOOD CULTURE RECEIVED NO GROWTH TO DATE CULTURE WILL BE HELD FOR 5 DAYS BEFORE ISSUING A FINAL NEGATIVE REPORT     Performed at [facility]   Report Status PENDING   Incomplete  GC/CHLAMYDIA PROBE AMP     Status: None   Collection Time    07/24/13  4:03 AM      Result Value Ref Range Status   CT Probe RNA NEGATIVE  NEGATIVE Final   GC Probe  RNA NEGATIVE  NEGATIVE Final   Comment: (NOTE)                                                                                               **Normal Reference Range: Negative**          Assay performed using the Gen-Probe APTIMA COMBO2 (R) Assay.     Acceptable specimen types for this assay include APTIMA Swabs (Unisex,     endocervical, urethral, or vaginal), first void urine, and ThinPrep     liquid based cytology samples.     Performed at [facility]     Studies:  Recent x-ray studies have been reviewed in detail by the Attending Physician  Scheduled Meds:  Scheduled Meds: . enoxaparin (LOVENOX) injection  40 mg Subcutaneous Q24H  . feeding supplement (ENSURE COMPLETE)  237 mL Oral BID BM  . hydrOXYzine  25 mg Oral TID  . levofloxacin (LEVAQUIN) IV  750 mg Intravenous Q24H  . sodium chloride  3 mL Intravenous Q12H    Time spent on care of this patient: 5mns   CAllie Bossier, MD   [facility] Office  [phone] - [phone] On-Call/Text Page:      [email or web address]      password [email or web address]  If 7PM-7AM, please contact night-coverage [email or web address] Password [email or web address]4/23/2015, 12:54 PM   LOS: 3 days

## 2013-07-25 NOTE — Consult Note (Signed)
Name: Madison Copeland MRN: [id number] DOB: July 07, 1985    ADMISSION DATE:  07/22/2013 CONSULTATION DATE:  07/25/13   REFERRING MD :  Sherral Hammers  PRIMARY SERVICE:  [facility]   CHIEF COMPLAINT:  bilat pulm infiltrates, dyspnea   BRIEF PATIENT DESCRIPTION: 28 yo female smoker with hx asthma, migraines, uterine fibroid, recently treated with bactrim/keflex for gluteal abscess (drained), presented 4/20 with worsening myalgias, fever, malaise.  She was admitted 4/20 by [facility] with sepsis/hypotension, SOB.  Treated initially as CAP.  CT chest revealed extensive bilat pulm infiltrates, ongoing fevers and PCCM consulted.    SIGNIFICANT EVENTS / STUDIES:  CT chest 4/23>>>Widespread alveolar opacities bilaterally with moderate pleural effusions bilaterally, There is involvement of all lobes in segments to a varying degree with this alveolar opacity, although these findings are primarily in a perihilar distribution. CT abdomen pelvis 4/20>>>-Probable recent rupture of right ovarian cyst versus recent right sided ovulation.  -Moderate amount of free fluid in the cul-de-sac of [place] and surrounding the right adnexa. 2D echo 4/21>>>- LVEF= 65% to 70%. -echodensity in the lateral left atrial wall in the proximity to the posterior leaflet of the mitral valve measuring 12 x 11 mm. The diiferential includes left atrial wall or possible mass like myxoma.  Cardiac MRI 4/22>>> 1. Significant bilateral airspace disease noted on views of lung fields. 2. No left atrial mass was noted. 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  3. Normal LV and RV size and systolic function.    LINES / TUBES: none  CULTURES: BCx2 4/21>>> RVP 4/21>>> neg  GC/chlam (urine) 4/22>>> neg  Urine 4/21>>> neg Mono 4/21>>> neg   AUTOIMMUNE:  Hepatitis panel 4/22>>> HIV 4/21>> NR RF 4/21>>> neg  ESR 4/21>>> 5  ANA 4/21>>> neg  CRP 4/21>>> 3.2 (H) RPR 4/21>>>  ANTIBIOTICS: Levaquin 4/22>>>  HISTORY OF PRESENT  ILLNESS:  27 yo female smoker with hx asthma, migraines, uterine fibroid, recently treated with bactrim/keflex for gluteal abscess, admitted 4/20 by [facility] with sepsis/hypotension, SOB.  Treated initially as CAP.  CT chest revealed extensive bilat pulm infiltrates and PCCM consulted.  Cont to c/o malaise, myalgias, ongoing nausea, mild chest pain, SOB.  Denies hemoptysis, purulent sputum, abd pain, headache, night sweats, weight loss.   PAST MEDICAL HISTORY :  Past Medical History  Diagnosis Date  . Asthma   . Migraine   . Fibroid, uterine     small intramural  . Tobacco abuse   . Anemia     during pregnancy   Past Surgical History  Procedure Laterality Date  . Cesarean section      x 2  . Tubal ligation     Prior to Admission medications   Medication Sig Start Date End Date Taking? Authorizing Provider  cephALEXin (KEFLEX) 500 MG capsule Take 500 mg by mouth 4 (four) times daily. Started medication on 07-15-13 07/14/13  Yes Antonietta Breach, PA-C  HYDROcodone-acetaminophen (NORCO/VICODIN) 5-325 MG per tablet Take 1-2 tablets by mouth every 6 (six) hours as needed. 07/14/13  Yes Antonietta Breach, PA-C  ibuprofen (ADVIL,MOTRIN) 200 MG tablet Take 800 mg by mouth every 6 (six) hours as needed for moderate pain.   Yes Historical Provider, MD  ondansetron (ZOFRAN ODT) 4 MG disintegrating tablet Take 1 tablet (4 mg total) by mouth every 8 (eight) hours as needed for nausea or vomiting. 07/19/13  Yes Kaitlyn Szekalski, PA-C  sulfamethoxazole-trimethoprim (BACTRIM DS) 800-160 MG per tablet Take 1 tablet by mouth 2 (two) times daily. Started  medication on 07-15-13   Yes Historical Provider, MD   Allergies  Allergen Reactions  . Zofran [Ondansetron Hcl]     Redness of face    FAMILY HISTORY:  Family History  Problem Relation Age of Onset  . Diabetes Mother   . Heart disease Mother   . Cervical cancer Maternal Aunt   . Kidney disease Maternal Grandmother    SOCIAL HISTORY:  reports that she has been  smoking Cigarettes.  She has a 3.75 pack-year smoking history. She has never used smokeless tobacco. She reports that she drinks alcohol. She reports that she does not use illicit drugs.  REVIEW OF SYSTEMS:   As per HPI - All other systems reviewed and were neg.    VITAL SIGNS: Temp:  [98.6 F (37 C)-102.4 F (39.1 C)] 98.6 F (37 C) (04/23 1300) Pulse Rate:  [81-93] 83 (04/23 1300) Resp:  [18-30] 30 (04/23 1300) BP: (81-135)/(55-113) 128/84 mmHg (04/23 1300) SpO2:  [92 %-100 %] 94 % (04/23 1300) Weight:  [125 lb (56.7 kg)] 125 lb (56.7 kg) (04/23 0348)  PHYSICAL EXAMINATION: General:  wdwn female, NAD but appears uncomfortable Neuro:  Awake, alert, appropriate HEENT:  Mm dry, no JVD Cardiovascular:  s1s2 rrr, mild tachy  Lungs:  resps even, non labored, mild tachypnea, diminished Abdomen:  Soft, +bs Musculoskeletal:  Warm and dry, no edema    Recent Labs Lab 07/23/13 1905 07/24/13 0310 07/25/13 0259  NA 134* 138 138  K 3.9 3.9 4.0  CL 109 105 103  CO2 16* 21 20  BUN 6 5* 3*  CREATININE 0.74 0.63 0.58  GLUCOSE 78 82 89    Recent Labs Lab 07/23/13 0339 07/24/13 0310 07/25/13 0259  HGB 9.7* 9.6* 9.7*  HCT 29.1* 28.7* 27.8*  WBC 3.6* 6.7 6.7  PLT 166 136* 140*   Ct Chest W Contrast  07/25/2013   CLINICAL DATA:  Parenchymal lung disease noted on recent MRI  EXAM: CT CHEST WITH CONTRAST  TECHNIQUE: Multidetector CT imaging of the chest was performed during intravenous contrast administration.  CONTRAST:  75m OMNIPAQUE IOHEXOL 300 MG/ML  SOLN  COMPARISON:  Cardiac MRI July 24, 2013  FINDINGS: There are bilateral pleural effusions. There is extensive airspace consolidation throughout the lungs bilaterally, predominantly in a perihilar distribution. There is no appreciable interstitial edema. There is no appreciable thoracic adenopathy. The pericardium is not thickened.  In the visualized upper abdomen, there is fatty liver. Visualized upper abdominal structures  otherwise appear normal. There are no blastic or lytic bone lesions. Thyroid appears normal.  IMPRESSION: Widespread alveolar opacities bilaterally with moderate pleural effusions bilaterally. This appearance would be consistent with congestive heart failure. If patient does not have other evidence of congestive heart failure, other etiologies must be considered including widespread pneumonia, autoimmune disease, or allergic type phenomenon of acute nature. There is involvement of all lobes in segments to a varying degree with this alveolar opacity, although these findings are primarily in a perihilar distribution. These findings may well warrant pulmonary consultation with consideration for bronchoscopy. Sputum cytology also could be helpful to further assess.   Electronically Signed   By: WLowella GripM.D.   On: 07/25/2013 10:29   Mr Card Morphology Wo/w Cm  07/24/2013   CLINICAL DATA:  Possible left atrial mass on echo  EXAM: CARDIAC MRI  TECHNIQUE: The patient was scanned on a 1.5 Tesla GE magnet. A dedicated cardiac coil was used. Functional imaging was done using Fiesta sequences. 2,3, and 4 chamber  views were done to assess for RWMA's. Modified Simpson's rule using a short axis stack was used to calculate an ejection fraction on a dedicated work Conservation officer, nature. The patient received 18 cc of Multihance. After 10 minutes inversion recovery sequences were used to assess for infiltration and scar tissue.  CONTRAST:  18 cc Multihance  FINDINGS: Difficult study due to respiratory artifact. Patient had significant exertional dyspnea making breath holds challenging. Views of the lung fields showed bilateral right > left small pleural effusions. There was extensive airspace disease bilaterally, predominantly in the lower lobes and in the peri-hilar area.  The left ventricle was normal size and systolic function, EF 19%. Normal wall motion and normal wall thickness. The right ventricle was normal  in size and systolic function. Normal left and right atrial sizes. There was no left atrial mass lesion. I suspect that the structure seen by echo was an enfolding of the left atrial wall. There was mild tricuspid regurgitation. There did not appear to be significant mitral regurgitation. No aortic stenosis or significant regurgitation.  On delayed enhancement imaging, there was no myocardial delayed enhancement.  MEASUREMENTS: MEASUREMENTS LV EDV 124 mL  LV SV 70 mL  LV EF 57%  IMPRESSION: 1. Significant bilateral airspace disease noted on views of lung fields. Should have clinical followup and dedicated imaging.  2. No left atrial mass was noted. 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  3.  Normal LV and RV size and systolic function.  Dalton Mclean   Electronically Signed   By: Loralie Champagne M.D.   On: 07/24/2013 15:20    ASSESSMENT / PLAN:  Extensive bilat pulm infiltrates - Autoimmune w/u essentially neg to date. ?organizing PNA v post inflammatory/ pneumonitis Presumed CAP   REC -  High dose steroids  RT to attempt induced sputum for culture If no improvement will need FOB for washings and bx  F/u CXR  Cont abx as above   Marijean Heath, NP 07/25/2013  2:24 PM Pager: [phone] or [phone]  *Care during the described time interval was provided by me and/or other providers on the critical care team. I have reviewed this patient's available data, including medical history, events of note, physical examination and test results as part of my evaluation.  Rush Farmer, M.D. [facility] Pulmonary/Critical Care Medicine. Pager: [phone]. After hours pager: [phone].

## 2013-07-26 MED ORDER — METHYLPREDNISOLONE SODIUM SUCC 125 MG IJ SOLR
60.0000 mg | Freq: Three times a day (TID) | INTRAMUSCULAR | Status: DC
Start: 2013-07-26 — End: 2013-07-27
  Administered 2013-07-26 – 2013-07-27 (×2): 60 mg via INTRAVENOUS
  Filled 2013-07-26 (×5): qty 0.96

## 2013-07-26 NOTE — Progress Notes (Signed)
Name: Madison Copeland MRN: [id number] DOB: 03/12/1986    ADMISSION DATE:  07/22/2013 CONSULTATION DATE:  07/25/13   REFERRING MD :  Sherral Hammers   CHIEF COMPLAINT:  bilat pulm infiltrates, dyspnea   BRIEF PATIENT DESCRIPTION:  28 yo female smoker with hx of asthma was recently started on bactrim/keflex for pilonidal cyst.  She was seen in ER on 4/17 with respiratory viral prodrome.  She was admitted with fever, malaise, myalgia, and hypotension.  Had progressive dyspnea, and CT chest showed b/l pulmonary infiltrates.  PCCM consulted to further assess.  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  She is not having sputum or hemoptysis.  Winded when she walks.  Oxygen desaturation when off nasal cannula.  VITAL SIGNS: Temp:  [97.5 F (36.4 C)-98.6 F (37 C)] 98.1 F (36.7 C) (04/24 0802) Pulse Rate:  [58-90] 62 (04/24 0802) Resp:  [23-30] 28 (04/24 0802) BP: (128-140)/(75-89) 140/84 mmHg (04/24 0802) SpO2:  [94 %-98 %] 95 % (04/24 0802) FiO2 (%):  [21 %] 21 % (04/23 1516) Weight:  [55.3 kg (121 lb 14.6 oz)] 55.3 kg (121 lb 14.6 oz) (04/24 0415)  PHYSICAL EXAMINATION: General: thin, speaks in full sentences Neuro: calm, pleasant, normal strength HEENT: no oral  exudate Cardiovascular: regular, no murmur Lungs: diminished breath sounds, decreased respiratory excursion, no wheeze/rales Abdomen:  Soft, non tender Musculoskeletal: no edema Skin: no rashes  CBC Recent Labs     07/24/13  0310  07/25/13  0259  WBC  6.7  6.7  HGB  9.6*  9.7*  HCT  28.7*  27.8*  PLT  136*  140*   BMET Recent Labs     07/23/13  1905  07/24/13  0310  07/25/13  0259  NA  134*  138  138  K  3.9  3.9  4.0  CL  109  105  103  CO2  16*  21  20  BUN  6  5*  3*  CREATININE  0.74  0.63  0.58  GLUCOSE  78  82  89    Electrolytes Recent Labs     07/23/13  1905  07/24/13  0310  07/25/13  0259  CALCIUM  7.6*  7.4*  7.9*   ABG Recent Labs     07/23/13  1837  PHART  7.420  PCO2ART  23.4*  PO2ART  58.6*    Liver Enzymes Recent Labs     07/24/13  0310  07/25/13  0259  AST  70*  58*  ALT  43*  46*  ALKPHOS  45  50  BILITOT  0.7  0.9  ALBUMIN  2.9*  2.9*    Cardiac Enzymes Recent Labs     07/24/13  0310  TROPONINI  <0.30  Imaging Ct Chest W Contrast  07/25/2013   CLINICAL DATA:  Parenchymal lung disease noted on recent MRI  EXAM: CT CHEST WITH CONTRAST  TECHNIQUE: Multidetector CT imaging of the chest was performed during intravenous contrast administration.  CONTRAST:  24m OMNIPAQUE IOHEXOL 300 MG/ML  SOLN  COMPARISON:  Cardiac MRI July 24, 2013  FINDINGS: There are bilateral pleural effusions. There is extensive airspace consolidation throughout the lungs bilaterally, predominantly in a perihilar distribution. There is no appreciable interstitial edema. There is no appreciable thoracic adenopathy. The pericardium is not thickened.  In the visualized upper abdomen, there is fatty liver. Visualized upper abdominal structures otherwise appear normal. There are no blastic or lytic bone lesions. Thyroid appears normal.  IMPRESSION: Widespread alveolar opacities bilaterally with moderate pleural effusions bilaterally. This appearance would be  consistent with congestive heart failure. If patient does not have other evidence of congestive heart failure, other etiologies must be considered including widespread pneumonia, autoimmune disease, or allergic type phenomenon of acute nature. There is involvement of all lobes in segments to a varying degree with this alveolar opacity, although these findings are primarily in a perihilar distribution. These findings may well warrant pulmonary consultation with consideration for bronchoscopy. Sputum cytology also could be helpful to further assess.   Electronically Signed   By: WLowella GripM.D.   On: 07/25/2013 10:29      ASSESSMENT:  28yo female smoker with respiratory viral prodrome and normal CXR on admission 4/20 developed progressive dyspnea, hypoxia, and now with b/l pulmonary infiltrates.  She was recently started on Abx of pilonidal cyst.  Differential is post-viral BOOP, infectious, acute lung injury from gluteal infection, aspiration in setting of nausea/vomiting, or other inflammatory process.  PLAN:  Continue levaquin Change solumedrol to 60 mg q8h F/u Respiratory viral panel F/u CXR 4/25 Oxygen to keep SpO2 > 92% Smoking cessation If no improvement through weekend, then may need bronchoscopy next week  SSchellsburgPager [phone] 3 pm If no answer page [phone]/24/2015, 11:14 AM  Reviewed above, examined pt.  Update pt's family about current findings, therapy plan, and possible need for bronchoscopy next week if no improvement.  VChesley Mires MD LPrime [facility] Suit[facility] LLCPulmonary/Critical Care 07/26/2013, 2:37 PM Pager:  [phone] 3pm call: [phone]

## 2013-07-26 NOTE — Progress Notes (Signed)
[facility] TEAM 1 - Stepdown/ICU TEAM Progress Note  Madison Copeland ERX:[id number] DOB: September 12, 1985 DOA: 07/22/2013 PCP: No PCP Per Patient  Admit HPI / Brief Narrative: Madison Copeland is a 28 y.o.BF PMHx  chronic abdominal pain, dysmenorrhea, fibroid, smoker, migraine.  Presented with complaints of fever and generalized pain. Patient recently has been seen in the ED earlier for skin abscess on her gluteal region. This was drained and she was placed on oral antibiotics as an outpatient Bactrim and Keflex. She returned 2 days later with persistent generalized weakness fatigue tiredness and fever with symptoms of flu. Workup was negative and her incision was looking healing well.  Today she comes back because her weakness has progressed she has generalized body ache and also had fever.  She continues to complain of nausea and vomiting that is ongoing since more than 2 weeks. She complains of some acid reflux and diffuse abdominal pain. She also complains of pain in her back. She denies any diarrhea or constipation or active bleeding denies any blood in the urine denies any burning when she urinates.  Patient appears poor historian as she is not able to remember her date of LMP, she is unable to remember her medications and is able to provide exact time lines and chronological events of the symptoms.  The patient is coming from home. And at her baseline Independent for most of her ADL.4/23 continued to complain of vague allover body aches, with sore throat. Positive fatigue, negative CP/SOB. Positive N/ with vomiting overnight     HPI/Subjective: 4/24 negative N./V. patient sitting in bed comfortably eating. Negative SOB while sitting still, but has not ambulated yet.  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 -Patient negative for infectious source/rheumatologic cause -Patient has been unresponsive to antibiotics and CT chest consistent with COP -Consulted PCCM.,Dr.  Jennet Maduro for possible BAL -4/24 patient ambulated around ward SpO2= 83% on room air -4/24 spoke with Dr. Titus Mould (PCCM) Will obtain CXR in a.m.; if no improvement BAL -Continue high-dose Solu-Medrol 60 mg TID   Anion gap Metabolic acidosis? -Resolved  -4/24 Anion gap= 15    Endocrine - TSH within normal limit   -Am. 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 But hypotension from ruptured ovarian cyst unlikely   Fever generalized fatigue joint aches  -ANA negative  -CRP elevated at 3.2 -ESR normal -Anti-RNP negative  -Rheumatoid factor negative   Reaction to Zofran  -Patient was given IV Zofran initially after which she developed redness of her face  and hives on her chest which is resolved with Benadryl. (Allergic  reaction?) -Hydroxyzine 25 mg TID -Benadryl when necessary    Code Status: Full  Family Communication: Mother was present at bedside, opportunity was given to ask question and all questions were answered satisfactorily at the time of interview.  Disposition: Resolution sepsis/COP .    Consultants: Dr.  Jennet Maduro (PCCM)   Procedure/Significant Events:  CT chest with contrast 07/25/2013 -Widespread alveolar opacities bilaterally; involvement of all lobes in segments to a varying degree w/ alveolar opacity; findings primarily in a perihilar distribution. - moderate pleural effusions bilaterally.     Cardiac MRI 07/23/2013 -Significant bilateral airspace disease  -No left atrial mass was noted; echo finding enfolding of the left atrial wall.  - Normal LV and RV size and systolic function.   Echocardiogram 07/23/2013 - LVEF= 65% to 70%.  -echodensity in the lateral left atrial wall in the proximity to the posterior leaflet of the mitral valve measuring 12 x 11 mm. The diiferential includes left atrial wall or possible mass like myxoma.    PCXR 07/22/2013 No active disease  CT abdomen pelvis with contrast 07/22/2013 -Probable recent rupture of right ovarian cyst versus recent right sided ovulation.  -Moderate amount of free fluid in the cul-de-sac of [place] and surrounding the right adnexa.    Culture 4/20 Blood cultures pending  Antibiotics: Zosyn 4/21>> stopped 4/22 Vancomycin 4/21>> stopped 4/20 Levofloxacin 4/22>>   DVT prophylaxis: Lovenox full dose   Devices    LINES / TUBES:  4/20 20 Ga right antecubital 4/20 20 Ga left antecubital    Continuous Infusions: . sodium chloride 100 mL/hr at 07/26/13 1903    Objective: VITAL SIGNS: Temp: 97.8 F (36.6 C) (04/24 1650) Temp src: Oral (04/24 1650) BP: 130/78 mmHg (04/24 1738) Pulse Rate: 60 (04/24 1115) FIO2:   Intake/Output Summary (Last 24 hours) at 07/26/13 1909 Last data filed at 07/26/13  0805  Gross per 24 hour  Intake   1103 ml  Output    400 ml  Net    703 ml     Exam: General: A./O. x4, NAD while sitting still   ENT; cervical lymphadenopathy resolved.  Lungs: Clear to auscultation bilaterally without wheezes or crackles in the upper lobes, negative breath sounds bibasilar Cardiovascular: Regular rate and rhythm without murmur gallop or rub normal S1 and S2 Abdomen: tender LLQ> LUQ, nondistended, soft, bowel sounds positive, no rebound, no ascites, no appreciable mass Extremities: No significant cyanosis, clubbing, or edema bilateral lower extremities  Data Reviewed: Basic Metabolic Panel:  Recent Labs Lab 07/22/13 1648 07/23/13 0339 07/23/13 1905 07/24/13 0310 07/25/13 0259  NA 135* 139 134* 138 138  K 3.8 3.8 3.9 3.9 4.0  CL 100 112 109 105 103  CO2 18* 15* 16* 21 20  GLUCOSE 90 86 78 82 89  BUN _0 5* 3*  CREATININE 0.98 0.66 0.74 0.63 0.58  CALCIUM 8.6 7.4* 7.6* 7.4* 7.9*   Liver Function Tests:  Recent Labs Lab 07/22/13 1648 07/23/13 0339 07/24/13 0310 07/25/13 0259  AST 59* 57* 70* 58*  ALT 35 33 43* 46*  ALKPHOS 54 40 45 50  BILITOT 0.5 0.5 0.7 0.9  PROT 7.5 5.6* 5.8* 6.1  ALBUMIN 3.7 2.8* 2.9* 2.9*    Recent Labs Lab 07/23/13 1112  LIPASE 11   No results found for this basename: AMMONIA,  in the last 168 hours CBC:  Recent Labs Lab 07/22/13 1648 07/23/13 0339 07/24/13 0310 07/25/13  0259  WBC 4.3 3.6* 6.7 6.7  NEUTROABS 3.4  --  5.0  --   HGB 12.3 9.7* 9.6* 9.7*  HCT 36.1 29.1* 28.7* 27.8*  MCV 95.0 95.4 94.4 93.3  PLT 189 166 136* 140*   Cardiac Enzymes:  Recent Labs Lab 07/24/13 0310  TROPONINI <0.30   BNP (last 3 results) No results found for this basename: PROBNP,  in the last 8760 hours CBG:  Recent Labs Lab 07/22/13 1956  GLUCAP 85    Recent Results (from the past 240 hour(s))  URINE CULTURE     Status: None   Collection Time    07/22/13  4:58 PM      Result Value Ref Range Status    Specimen Description URINE, CLEAN CATCH   Final   Special Requests NONE   Final   Culture  Setup Time     Final   Value: 07/23/2013 00:57     Performed at [facility]     Final   Value: NO GROWTH     Performed at [facility]   Culture     Final   Value: NO GROWTH     Performed at [facility]   Report Status 07/23/2013 FINAL   Final  CULTURE, BLOOD (ROUTINE X 2)     Status: None   Collection Time    07/22/13  7:58 PM      Result Value Ref Range Status   Specimen Description BLOOD ARM RIGHT   Final   Special Requests BOTTLES DRAWN AEROBIC AND ANAEROBIC 5CC   Final   Culture  Setup Time     Final   Value: 07/23/2013 04:12     Performed at [facility]   Culture     Final   Value:        BLOOD CULTURE RECEIVED NO GROWTH TO DATE CULTURE WILL BE HELD FOR 5 DAYS BEFORE ISSUING A FINAL NEGATIVE REPORT     Performed at [facility]   Report Status PENDING   Incomplete  CULTURE, BLOOD (ROUTINE X 2)     Status: None   Collection Time    07/22/13  8:10 PM      Result Value Ref Range Status   Specimen Description BLOOD ARM RIGHT   Final   Special Requests BOTTLES DRAWN AEROBIC AND ANAEROBIC 10CC   Final   Culture  Setup Time     Final   Value: 07/23/2013 04:12     Performed at [facility]   Culture     Final   Value:        BLOOD CULTURE RECEIVED NO GROWTH TO DATE CULTURE WILL BE HELD FOR 5 DAYS BEFORE ISSUING A FINAL NEGATIVE REPORT     Performed at [facility]   Report Status PENDING   Incomplete  MRSA PCR SCREENING     Status: None   Collection Time    07/23/13 12:00 AM      Result Value Ref Range Status   MRSA by PCR NEGATIVE  NEGATIVE Final   Comment:            The GeneXpert MRSA Assay (FDA     approved for NASAL specimens     only), is one component of a     comprehensive MRSA colonization     surveillance program. It is not     intended to diagnose MRSA     infection nor to  guide or     monitor  treatment for     MRSA infections.  RESPIRATORY VIRUS PANEL     Status: None   Collection Time    07/23/13  8:06 PM      Result Value Ref Range Status   Source - RVPAN NASAL SWAB   Corrected   Comment: CORRECTED ON 04/22 AT 1814: PREVIOUSLY REPORTED AS NASAL SWAB   Respiratory Syncytial Virus A NOT DETECTED   Final   Respiratory Syncytial Virus B NOT DETECTED   Final   Influenza A NOT DETECTED   Final   Influenza B NOT DETECTED   Final   Parainfluenza 1 NOT DETECTED   Final   Parainfluenza 2 NOT DETECTED   Final   Parainfluenza 3 NOT DETECTED   Final   Metapneumovirus NOT DETECTED   Final   Rhinovirus NOT DETECTED   Final   Adenovirus NOT DETECTED   Final   Influenza A H1 NOT DETECTED   Final   Influenza A H3 NOT DETECTED   Final   Comment: (NOTE)           Normal Reference Range for each Analyte: NOT DETECTED     Testing performed using the Luminex xTAG Respiratory Viral Panel test     kit.     This test was developed and its performance characteristics determined     by [facility]. It has not been cleared or approved by the [place]     Food and Drug Administration. This test is used for clinical purposes.     It should not be regarded as investigational or for research. This     laboratory is certified under the [place] (CLIA) as qualified to perform high complexity     clinical laboratory testing.     Performed at [facility], BLOOD (ROUTINE X 2)     Status: None   Collection Time    07/23/13  8:55 PM      Result Value Ref Range Status   Specimen Description BLOOD RIGHT HAND   Final   Special Requests     Final   Value: BOTTLES DRAWN AEROBIC AND ANAEROBIC 10CC BLUE,5CC RED   Culture  Setup Time     Final   Value: 07/24/2013 00:16     Performed at [facility]   Culture     Final   Value:        BLOOD CULTURE RECEIVED NO GROWTH TO DATE CULTURE WILL BE HELD FOR 5 DAYS BEFORE ISSUING A FINAL  NEGATIVE REPORT     Performed at [facility]   Report Status PENDING   Incomplete  CULTURE, BLOOD (ROUTINE X 2)     Status: None   Collection Time    07/23/13  9:13 PM      Result Value Ref Range Status   Specimen Description BLOOD RIGHT UPPER ARM   Final   Special Requests     Final   Value: BOTTLES DRAWN AEROBIC AND ANAEROBIC 10CC BLUE,4CC RED   Culture  Setup Time     Final   Value: 07/24/2013 00:28     Performed at [facility]   Culture     Final   Value:        BLOOD CULTURE RECEIVED NO GROWTH TO DATE CULTURE WILL BE HELD FOR 5 DAYS BEFORE ISSUING A FINAL NEGATIVE REPORT  Performed at [facility]   Report Status PENDING   Incomplete  GC/CHLAMYDIA PROBE AMP     Status: None   Collection Time    07/24/13  4:03 AM      Result Value Ref Range Status   CT Probe RNA NEGATIVE  NEGATIVE Final   GC Probe RNA NEGATIVE  NEGATIVE Final   Comment: (NOTE)                                                                                               **Normal Reference Range: Negative**          Assay performed using the Gen-Probe APTIMA COMBO2 (R) Assay.     Acceptable specimen types for this assay include APTIMA Swabs (Unisex,     endocervical, urethral, or vaginal), first void urine, and ThinPrep     liquid based cytology samples.     Performed at [facility]     Studies:  Recent x-ray studies have been reviewed in detail by the Attending Physician  Scheduled Meds:  Scheduled Meds: . enoxaparin (LOVENOX) injection  40 mg Subcutaneous Q24H  . feeding supplement (ENSURE COMPLETE)  237 mL Oral BID BM  . hydrOXYzine  25 mg Oral TID  . levofloxacin (LEVAQUIN) IV  750 mg Intravenous Q24H  . methylPREDNISolone (SOLU-MEDROL) injection  60 mg Intravenous 3 times per day  . sodium chloride  3 mL Intravenous Q12H    Time spent on care of this patient: 60mns   CAllie Bossier, MD   [facility] Office  [phone] [phone] On-Call/Text Page:      [email or web address]      password [email or web address]  If 7PM-7AM, please contact night-coverage [email or web address] Password [email or web address] 07/26/2013, 7:09 PM   LOS: 4 days

## 2013-07-27 ENCOUNTER — Inpatient Hospital Stay (HOSPITAL_COMMUNITY): Payer: Medicaid Other

## 2013-07-27 LAB — GLUCOSE, CAPILLARY: Glucose-Capillary: 191 mg/dL — ABNORMAL HIGH (ref 70–99)

## 2013-07-27 MED ORDER — IBUPROFEN 800 MG PO TABS
800.0000 mg | ORAL_TABLET | Freq: Four times a day (QID) | ORAL | Status: DC | PRN
  Administered 2013-07-27: 800 mg via ORAL
  Filled 2013-07-27 (×2): qty 1

## 2013-07-27 MED ORDER — IBUPROFEN 800 MG PO TABS
800.0000 mg | ORAL_TABLET | Freq: Three times a day (TID) | ORAL | Status: DC
  Administered 2013-07-27 – 2013-07-28 (×3): 800 mg via ORAL
  Filled 2013-07-27 (×7): qty 1

## 2013-07-27 MED ORDER — LISINOPRIL 5 MG PO TABS
5.0000 mg | ORAL_TABLET | Freq: Every day | ORAL | Status: DC
  Administered 2013-07-27: 5 mg via ORAL
  Filled 2013-07-27 (×2): qty 1

## 2013-07-27 MED ORDER — METHYLPREDNISOLONE SODIUM SUCC 40 MG IJ SOLR
40.0000 mg | Freq: Two times a day (BID) | INTRAMUSCULAR | Status: DC
  Administered 2013-07-27 – 2013-07-29 (×4): 40 mg via INTRAVENOUS
  Filled 2013-07-27 (×6): qty 1

## 2013-07-27 NOTE — Progress Notes (Signed)
TEAM 1 - Stepdown/ICU TEAM Progress Note  [name] EHO:[id number] DOB: Aug 25, 1985 DOA: 07/22/2013 PCP: No PCP Per Patient  Admit HPI / Brief Narrative: Madison Copeland is a 28 y.o.BF PMHx  chronic abdominal pain, dysmenorrhea, fibroid, smoker, migraine.  Presented with complaints of fever and generalized pain. Patient recently has been seen in the ED earlier for skin abscess on her gluteal region. This was drained and she was placed on oral antibiotics as an outpatient Bactrim and Keflex. She returned 2 days later with persistent generalized weakness fatigue tiredness and fever with symptoms of flu. Workup was negative and her incision was looking healing well.  Today she comes back because her weakness has progressed she has generalized body ache and also had fever.  She continues to complain of nausea and vomiting that is ongoing since more than 2 weeks. She complains of some acid reflux and diffuse abdominal pain. She also complains of pain in her back. She denies any diarrhea or constipation or active bleeding denies any blood in the urine denies any burning when she urinates.  Patient appears poor historian as she is not able to remember her date of LMP, she is unable to remember her medications and is able to provide exact time lines and chronological events of the symptoms.  The patient is coming from home. And at her baseline Independent for most of her ADL.4/23 continued to complain of vague allover body aches, with sore throat. Positive fatigue, negative CP/SOB. Positive N/ with vomiting overnight 4/24 negative N./V. patient sitting in bed comfortably eating. Negative SOB while sitting still, but has not ambulated yet.    HPI/Subjective: 4/25 patient states feels that SOB decreasing, however continues to have bilateral pain in the back when taking deep breaths. Negative cough, negative N./V.   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 -Patient negative for infectious source/rheumatologic cause -Patient has been unresponsive to antibiotics and CT chest consistent with COP -Consulted PCCM.,Dr.  Jennet Maduro for possible BAL -4/24 patient ambulated around ward SpO2= 83% on room air -4/24 spoke with Dr. Titus Mould (PCCM) Will obtain CXR in a.m.; if no improvement BAL -Continue high-dose Solu-Medrol 40 mg BID -4/25 Patient is safe to transfer to floor; encouraged to ambulate minimum once per shift -4/25 per pulmonology obtain CXR on Monday   HTN  (iatrogenic)  -Most likely secondary to patient's high dose steroid. Start lisinopril 5 mg daily     Anion gap Metabolic acidosis? -Resolved  -4/24 Anion gap= 15  -4/25 obtain BMP, magnesium in a.m.   Endocrine - TSH within normal limit   -Am. 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 But hypotension from ruptured ovarian cyst unlikely  -Patient prefers Motrin PRN for pain. 800 mg TID with food  Fever generalized fatigue joint aches  -ANA negative  -CRP elevated at 3.2 -ESR normal -Anti-RNP negative  -Rheumatoid factor negative   Reaction to Zofran  -Patient was given IV Zofran initially after which she developed redness of her face and hives on her chest which is resolved with Benadryl. (Allergic reaction?) -Hydroxyzine 25 mg TID -Benadryl when necessary    Code Status: Full  Family Communication: Mother was present at bedside, opportunity was given to ask question and all questions were answered satisfactorily at the time of interview.  Disposition: Resolution sepsis/COP .    Consultants: Dr.  Jennet Maduro (PCCM)   Procedure/Significant Events:  CT chest with contrast 07/25/2013 -Widespread alveolar opacities bilaterally; involvement of all lobes in segments to a varying degree w/ alveolar opacity; findings primarily in a perihilar distribution. - moderate pleural effusions bilaterally.     Cardiac MRI 07/23/2013 -Significant bilateral airspace disease  -No left atrial mass was noted; echo finding enfolding of the left atrial wall.  - Normal LV and RV size and systolic function.   Echocardiogram 07/23/2013 - LVEF= 65% to 70%.  -echodensity in the lateral left atrial wall in the proximity to the posterior leaflet of the mitral valve measuring 12 x 11 mm. The diiferential includes left atrial wall or possible mass like myxoma.    PCXR 07/22/2013 No active disease  CT abdomen pelvis with contrast 07/22/2013 -Probable recent rupture of right ovarian cyst versus recent right sided ovulation.  -Moderate amount of free fluid in the cul-de-sac of [place] and surrounding the right adnexa.    Culture 4/20 Blood cultures  pending  Antibiotics: Zosyn 4/21>> stopped 4/22 Vancomycin 4/21>> stopped 4/20 Levofloxacin 4/22>>   DVT prophylaxis: Lovenox full dose   Devices    LINES / TUBES:  4/20 20 Ga right antecubital 4/20 20 Ga left antecubital    Continuous Infusions: . sodium chloride 100 mL/hr at 07/27/13 0524    Objective: VITAL SIGNS: Temp: 98.1 F (36.7 C) (04/25 1222) Temp src: Oral (04/25 1222) BP: 158/99 mmHg (04/25 1222) Pulse Rate: 63 (04/25 1222) FIO2:   Intake/Output Summary (Last 24 hours) at 07/27/13 1322 Last data filed at 07/27/13 0900  Gross per 24 hour  Intake   1520 ml  Output      0 ml  Net   1520 ml     Exam: General: A./O. x4, NAD negative SOB while sitting still   ENT; cervical lymphadenopathy resolved.  Lungs: Clear to auscultation bilaterally without wheezes or crackles in the upper lobes, improved breath sounds bibasilar but still muted Rt > Lt Cardiovascular: Regular rate and rhythm without murmur gallop or rub normal S1 and S2 Abdomen: tender LLQ> LUQ, nondistended, soft, bowel sounds positive, no rebound, no ascites, no appreciable mass Extremities: No significant cyanosis, clubbing, or edema bilateral lower extremities  Data Reviewed: Basic Metabolic Panel:  Recent Labs Lab 07/22/13 1648 07/23/13 0339 07/23/13 1905 07/24/13 0310 07/25/13 0259  NA 135* 139 134* 138 138  K 3.8 3.8 3.9 3.9 4.0  CL 100 112 109 105 103  CO2 18* 15* 16* 21 20  GLUCOSE 90 86 78 82 89  BUN '11 8 6 ' 5*  3*  CREATININE 0.98 0.66 0.74 0.63 0.58  CALCIUM 8.6 7.4* 7.6* 7.4* 7.9*   Liver Function Tests:  Recent Labs Lab 07/22/13 1648 07/23/13 0339 07/24/13 0310 07/25/13 0259  AST 59* 57* 70* 58*  ALT 35 33 43* 46*  ALKPHOS 54 40 45 50  BILITOT 0.5 0.5 0.7 0.9  PROT 7.5 5.6* 5.8* 6.1  ALBUMIN 3.7 2.8* 2.9* 2.9*    Recent Labs Lab 07/23/13 1112  LIPASE 11   No results found for this basename: AMMONIA,  in the last 168 hours CBC:  Recent Labs Lab  07/22/13 1648 07/23/13 0339 07/24/13 0310 07/25/13 0259  WBC 4.3 3.6* 6.7 6.7  NEUTROABS 3.4  --  5.0  --   HGB 12.3 9.7* 9.6* 9.7*  HCT 36.1 29.1* 28.7* 27.8*  MCV 95.0 95.4 94.4 93.3  PLT 189 166 136* 140*   Cardiac Enzymes:  Recent Labs Lab 07/24/13 0310  TROPONINI <0.30   BNP (last 3 results) No results found for this basename: PROBNP,  in the last 8760 hours CBG:  Recent Labs Lab 07/22/13 1956  GLUCAP 85    Recent Results (from the past 240 hour(s))  URINE CULTURE     Status: None   Collection Time    07/22/13  4:58 PM      Result Value Ref Range Status   Specimen Description URINE, CLEAN CATCH   Final   Special Requests NONE   Final   Culture  Setup Time     Final   Value: 07/23/2013 00:57     Performed at [place]     Final   Value: NO GROWTH     Performed at [facility]   Culture     Final   Value: NO GROWTH     Performed at [facility]   Report Status 07/23/2013 FINAL   Final  CULTURE, BLOOD (ROUTINE X 2)     Status: None   Collection Time    07/22/13  7:58 PM      Result Value Ref Range Status   Specimen Description BLOOD ARM RIGHT   Final   Special Requests BOTTLES DRAWN AEROBIC AND ANAEROBIC 5CC   Final   Culture  Setup Time     Final   Value: 07/23/2013 04:12     Performed at [facility]   Culture     Final   Value:        BLOOD CULTURE RECEIVED NO GROWTH TO DATE CULTURE WILL BE HELD FOR 5 DAYS BEFORE ISSUING A FINAL NEGATIVE REPORT     Performed at [facility]   Report Status PENDING   Incomplete  CULTURE, BLOOD (ROUTINE X 2)     Status: None   Collection Time    07/22/13  8:10 PM      Result Value Ref Range Status   Specimen Description BLOOD ARM RIGHT   Final   Special Requests BOTTLES DRAWN AEROBIC AND ANAEROBIC 10CC   Final   Culture  Setup Time     Final   Value: 07/23/2013 04:12     Performed at [facility]   Culture     Final   Value:        BLOOD CULTURE  RECEIVED NO GROWTH TO DATE CULTURE WILL BE HELD FOR 5 DAYS BEFORE ISSUING A FINAL NEGATIVE REPORT     Performed at [facility]   Report Status PENDING   Incomplete  MRSA PCR SCREENING     Status: None   Collection Time    07/23/13 12:00 AM      Result Value Ref Range Status   MRSA by PCR NEGATIVE  NEGATIVE Final   Comment:            The GeneXpert MRSA Assay (FDA     approved for NASAL specimens     only), is one component of a     comprehensive MRSA colonization     surveillance program. It is not     intended to diagnose MRSA     infection nor to guide or     monitor treatment for     MRSA infections.  RESPIRATORY VIRUS PANEL     Status: None   Collection Time    07/23/13  8:06 PM      Result Value Ref Range Status   Source - RVPAN NASAL SWAB   Corrected   Comment: CORRECTED ON 04/22 AT 1814: PREVIOUSLY REPORTED AS NASAL SWAB   Respiratory Syncytial Virus A NOT DETECTED   Final   Respiratory Syncytial Virus B NOT DETECTED   Final   Influenza A NOT DETECTED   Final   Influenza B NOT DETECTED   Final   Parainfluenza 1 NOT DETECTED   Final   Parainfluenza 2 NOT DETECTED   Final   Parainfluenza 3 NOT DETECTED   Final   Metapneumovirus NOT DETECTED   Final   Rhinovirus NOT DETECTED   Final   Adenovirus NOT DETECTED   Final   Influenza A H1 NOT DETECTED   Final   Influenza A H3 NOT DETECTED   Final   Comment: (NOTE)           Normal Reference Range for each Analyte: NOT DETECTED     Testing performed using the Luminex xTAG Respiratory Viral Panel test     kit.     This test was developed and its performance characteristics determined     by [facility]. It has not been cleared or approved by the [place]     Food and Drug Administration. This test is used for clinical purposes.     It should not be regarded as investigational or for research. This     laboratory is certified under the [place] (CLIA) as qualified to  perform high complexity     clinical laboratory testing.     Performed at [facility], BLOOD (ROUTINE X 2)     Status: None   Collection Time    07/23/13  8:55 PM      Result Value Ref Range Status   Specimen Description BLOOD RIGHT HAND   Final   Special Requests     Final   Value: BOTTLES DRAWN AEROBIC AND ANAEROBIC 10CC BLUE,5CC RED   Culture  Setup Time     Final   Value: 07/24/2013 00:16     Performed at [facility]   Culture     Final   Value:        BLOOD CULTURE RECEIVED NO GROWTH TO DATE CULTURE WILL BE HELD FOR 5 DAYS BEFORE ISSUING A FINAL NEGATIVE REPORT     Performed at [facility]   Report Status PENDING   Incomplete  CULTURE, BLOOD (ROUTINE X 2)     Status: None   Collection Time    07/23/13  9:13 PM  Result Value Ref Range Status   Specimen Description BLOOD RIGHT UPPER ARM   Final   Special Requests     Final   Value: BOTTLES DRAWN AEROBIC AND ANAEROBIC 10CC BLUE,4CC RED   Culture  Setup Time     Final   Value: 07/24/2013 00:28     Performed at [facility]   Culture     Final   Value:        BLOOD CULTURE RECEIVED NO GROWTH TO DATE CULTURE WILL BE HELD FOR 5 DAYS BEFORE ISSUING A FINAL NEGATIVE REPORT     Performed at [facility]   Report Status PENDING   Incomplete  GC/CHLAMYDIA PROBE AMP     Status: None   Collection Time    07/24/13  4:03 AM      Result Value Ref Range Status   CT Probe RNA NEGATIVE  NEGATIVE Final   GC Probe RNA NEGATIVE  NEGATIVE Final   Comment: (NOTE)                                                                                               **Normal Reference Range: Negative**          Assay performed using the Gen-Probe APTIMA COMBO2 (R) Assay.     Acceptable specimen types for this assay include APTIMA Swabs (Unisex,     endocervical, urethral, or vaginal), first void urine, and ThinPrep     liquid based cytology samples.     Performed at [facility]      Studies:  Recent x-ray studies have been reviewed in detail by the Attending Physician  Scheduled Meds:  Scheduled Meds: . enoxaparin (LOVENOX) injection  40 mg Subcutaneous Q24H  . feeding supplement (ENSURE COMPLETE)  237 mL Oral BID BM  . hydrOXYzine  25 mg Oral TID  . levofloxacin (LEVAQUIN) IV  750 mg Intravenous Q24H  . methylPREDNISolone (SOLU-MEDROL) injection  40 mg Intravenous Q12H  . sodium chloride  3 mL Intravenous Q12H    Time spent on care of this patient: 66mns   CAllie Bossier, MD   [facility] Office  [phone] - [phone] On-Call/Text Page:      [email or web address]      password [email or web address]  If 7PM-7AM, please contact night-coverage [email or web address] Password [email or web address] 07/27/2013, 1:22 PM   LOS: 5 days

## 2013-07-27 NOTE — Progress Notes (Signed)
Madison Copeland is a 28 y.o. female patient who transferred  from 21M awake, alert  & orientated  X 3, Full Code, VSS - Blood pressure 144/91, pulse 67, temperature 98.1 F (36.7 C), temperature source Oral, resp. rate 16, height 5\' 2"  (1.575 m), weight 56.246 kg (124 lb), last menstrual period 07/01/2013, SpO2 90.00%., R/A, no c/o shortness of breath, no c/o chest pain, no distress noted. Non-Tele.   IV site WDL: forearm right, condition patent and no redness with a transparent dsg that's clean dry and intact.  Allergies:   Allergies  Allergen Reactions  . Zofran [Ondansetron Hcl]     Redness of face     Past Medical History  Diagnosis Date  . Asthma   . Migraine   . Fibroid, uterine     small intramural  . Tobacco abuse   . Anemia     during pregnancy    Pt orientation to unit, room and routine. SR up x 2, fall risk assessment complete with Patient and family verbalizing understanding of risks associated with falls. Pt verbalizes an understanding of how to use the call bell and to call for help before getting out of bed.  Skin, clean-dry- intact without evidence of bruising, or skin tears.   No evidence of skin break down noted on exam, pt. Has had a cyst on left buttock but says its nothing now.    Will cont to monitor and assist as needed.  Der Gagliano Murvin Natal, RN 07/27/2013 5:34 PM

## 2013-07-27 NOTE — Progress Notes (Signed)
Name: Madison Copeland MRN: [id number] DOB: 03/06/1986    ADMISSION DATE:  07/22/2013 CONSULTATION DATE:  07/25/13   REFERRING MD :  Sherral Hammers   CHIEF COMPLAINT:  bilat pulm infiltrates, dyspnea   BRIEF PATIENT DESCRIPTION:  28 yo female smoker with hx of asthma was recently started on bactrim/keflex for pilonidal cyst.  She was seen in ER on 4/17 with respiratory viral prodrome.  She was admitted with fever, malaise, myalgia, and hypotension.  Had progressive dyspnea, and CT chest showed b/l pulmonary infiltrates.  PCCM consulted to further assess.  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  VITAL SIGNS: Temp:  [97.8 F (36.6 C)-98.3 F (36.8 C)] 98.1 F (36.7 C) (04/25 1222) Pulse Rate:  [56-67] 63 (04/25 1222) Resp:  [17-30] 26 (04/25 1222) BP: (129-170)/(71-99) 158/99 mmHg (04/25 1222) SpO2:  [94 %-96 %] 94 % (04/25 1222)  PHYSICAL EXAMINATION: General: wd female in nad Neuro: alert, moves all 4. HEENT: no purulence or d/c from nares, no LN or TMG Cardiovascular: regular, no murmur Lungs:  Subtle crackles in bases Abdomen:  Soft, non tender Musculoskeletal: no edema  CBC Recent Labs     07/25/13  0259  WBC  6.7  HGB  9.7*  HCT  27.8*  PLT  140*    BMET Recent Labs     07/25/13  0259  NA  138  K  4.0  CL  103  CO2  20  BUN  3*  CREATININE  0.58  GLUCOSE  89    Electrolytes Recent Labs     07/25/13  0259  CALCIUM  7.9*   ABG No results found for this basename: PHART, PCO2ART, PO2ART,  in the last 72 hours  Liver Enzymes Recent Labs     07/25/13  0259  AST  58*  ALT  46*  ALKPHOS  50  BILITOT  0.9  ALBUMIN  2.9*    Cardiac Enzymes No results found for this basename: TROPONINI, PROBNP,  in the last 72 hours Imaging Dg Chest Port 1 View  07/27/2013   CLINICAL DATA:  Infiltrates  EXAM: PORTABLE CHEST - 1 VIEW  COMPARISON:  Prior chest x-ray 07/22/2013; prior chest CT 07/25/2013  FINDINGS: Comparing the scout view from the recent prior CT scan of the chest, there is been significant interval improvement in the appearance of diffuse bilateral interstitial and airspace opacities. There is some residual focal opacity in the left retrocardiac region. Persistent small bilateral layering pleural effusions. Cardiac and mediastinal contours are stable. No pneumothorax. No acute osseous abnormality.  IMPRESSION: Significant interval improvement in diffuse bilateral interstitial and airspace opacities compared to chest CT dated 07/25/2013.  Persistent left retrocardiac opacity may reflect residual atelectasis or infiltrate.  Small bilateral pleural effusions.   Electronically Signed  By: Jacqulynn Cadet M.D.   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  She has improved clinically and by xray with steroids.  Her autoimmune labs are pending.   PLAN:  -decrease steroids, but continue IV form thru weekend -check cxr on Monday,and can convert to po prednisone if better.   Will see again on Monday.  Call if issues arise this weekend.

## 2013-07-28 DIAGNOSIS — J84116 Cryptogenic organizing pneumonia: Principal | ICD-10-CM

## 2013-07-28 LAB — BASIC METABOLIC PANEL
BUN: 14 mg/dL (ref 6–23)
CO2: 18 mEq/L — ABNORMAL LOW (ref 19–32)
Calcium: 7.8 mg/dL — ABNORMAL LOW (ref 8.4–10.5)
Chloride: 109 mEq/L (ref 96–112)
Creatinine, Ser: 0.5 mg/dL (ref 0.50–1.10)
GFR calc Af Amer: >90 mL/min (ref >90)
Glucose, Bld: 155 mg/dL — ABNORMAL HIGH (ref 70–99)
POTASSIUM: 3.5 meq/L — AB (ref 3.7–5.3)
SODIUM: 141 meq/L (ref 137–147)

## 2013-07-28 LAB — MAGNESIUM: Magnesium: 1.6 mg/dL (ref 1.5–2.5)

## 2013-07-28 LAB — GLUCOSE, CAPILLARY
GLUCOSE-CAPILLARY: 210 mg/dL — AB (ref 70–99)
Glucose-Capillary: 157 mg/dL — ABNORMAL HIGH (ref 70–99)

## 2013-07-28 MED ORDER — HYDRALAZINE HCL 20 MG/ML IJ SOLN: 10.0000 mg | Freq: Four times a day (QID) | INTRAMUSCULAR | Status: DC | PRN

## 2013-07-28 MED ORDER — MAGNESIUM SULFATE 40 MG/ML IJ SOLN
2.0000 g | Freq: Once | INTRAMUSCULAR | Status: AC
  Administered 2013-07-28: 2 g via INTRAVENOUS
  Filled 2013-07-28: qty 50

## 2013-07-28 MED ORDER — AMLODIPINE BESYLATE 5 MG PO TABS
5.0000 mg | ORAL_TABLET | Freq: Every day | ORAL | Status: DC
  Administered 2013-07-28 – 2013-07-29 (×2): 5 mg via ORAL
  Filled 2013-07-28 (×2): qty 1

## 2013-07-28 NOTE — Progress Notes (Signed)
PATIENT DETAILS Name: Madison Copeland Age: 28 y.o. Sex: female Date of Birth: 1985-08-08 Admit Date: 07/22/2013 Admitting Physician Berle Mull, MD PCP:No PCP Per Patient  Summary: 28 yo female smoker with hx of asthma, chronic left sided abdominal pain, fibroid uterus was recently started (on 4/12) bactrim/keflex for pilonidal cyst.Presented on 4/20 fever, gen myalgias and malaise. Admitted and further work up included CT chest revealed extensive bilat pulm infiltrates. 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 Improving slowly, transferred to [facility] on 07/28/13.   Subjective:  No major complaints. Denies SOB. Continues to have intermittent abdominal pain. Denies vomiting.  Assessment/Plan: Principal Problem:   Cryptogenic organizing pneumonia -admitted with fever/myalgia's and a normal chest xray on 4/20, further work up including CT chest/repeat CXR showed extensive bilat pulm infiltrates.ANA and RA factor neg,CRP elevated at 3.2 however ESR only 5.Influenza and Resp Virus panel negative.HIV was also negative. 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 Plans are for bronchoscopy with BAL if no improvement. -Continue with IV Solumedrol and empiric Levaquin (started 4/22), repeat CXR on 4/27, await PCCM follow up for further recommendations.  Microbiology Data Blood culture 4/21>neg Influenza PCR 4/21 >negative  Respiratory viral panel 4/24 >negative GC/chlam (urine) 4/22>>> negative  Urine 4/21>>> negative  Mono 4/21>>> negative   Active Problems: Chronic Abd Pain -CT Abd on 4/20 neg for acute abnormalities -see's Dr Fuller Plan as outpatient, belly is soft, will defer continued work up to be done in the outpatient setting.   Mild Transaminitis -resolving -?etiology, ?from mild hypotension -acute hepatitis serology negative -continue to monitor  periodically  HTN -stop IV, stop Lisinopril-coughing -start amlodipine-and adjust meds accordingly  Hypotension -resolved. -likely secondary to above  Non Gap Acidosis -resolved  Echo 4/21 showed ?Myxoma-Cardiac MRI on 4/22 negative  Disposition: Remain inpatient  DVT Prophylaxis: Prophylactic Lovenox   Code Status: Full code   Family Communication Mother at bedside  Procedures:  None  CONSULTS:  pulmonary/intensive care   MEDICATIONS: Scheduled Meds: . enoxaparin (LOVENOX) injection  40 mg Subcutaneous Q24H  . feeding supplement (ENSURE COMPLETE)  237 mL Oral BID BM  . hydrOXYzine  25 mg Oral TID  . ibuprofen  800 mg Oral TID  . levofloxacin (LEVAQUIN) IV  750 mg Intravenous Q24H  . lisinopril  5 mg Oral Daily  . methylPREDNISolone (SOLU-MEDROL) injection  40 mg Intravenous Q12H  . sodium chloride  3 mL Intravenous Q12H   Continuous Infusions: . sodium chloride 100 mL/hr at 07/28/13 0328   PRN Meds:.acetaminophen, albuterol, butamben-tetracaine-benzocaine, diphenhydrAMINE, HYDROcodone-acetaminophen, promethazine  Antibiotics: Anti-infectives   Start     Dose/Rate Route Frequency Ordered Stop   07/24/13 1800  levofloxacin (LEVAQUIN) IVPB 750 mg     750 mg 100 mL/hr over 90 Minutes Intravenous Every 24 hours 07/24/13 1631     07/23/13 2100  piperacillin-tazobactam (ZOSYN) IVPB 3.375 g  Status:  Discontinued     3.375 g 12.5 mL/hr over 240 Minutes Intravenous Every 8 hours 07/23/13 2006 07/24/13 1631   07/23/13 2100  vancomycin (VANCOCIN) IVPB 750 mg/150 ml premix  Status:  Discontinued     750 mg 150 mL/hr over 60 Minutes Intravenous Every 8 hours 07/23/13 2006 07/24/13 1631   07/23/13 0500  vancomycin (VANCOCIN) IVPB 750 mg/150 ml premix  Status:  Discontinued     750 mg 150 mL/hr over 60 Minutes Intravenous Every 8 hours 07/22/13 2029  07/23/13 0025   07/23/13 0300  piperacillin-tazobactam (ZOSYN) IVPB 3.375 g  Status:  Discontinued     3.375 g 12.5  mL/hr over 240 Minutes Intravenous Every 8 hours 07/22/13 2029 07/23/13 0025   07/22/13 2030  piperacillin-tazobactam (ZOSYN) IVPB 3.375 g     3.375 g 100 mL/hr over 30 Minutes Intravenous  Once 07/22/13 2017 07/22/13 2059   07/22/13 2030  vancomycin (VANCOCIN) IVPB 1000 mg/200 mL premix     1,000 mg 200 mL/hr over 60 Minutes Intravenous  Once 07/22/13 2017 07/22/13 2129       PHYSICAL EXAM: Vital signs in last 24 hours: Filed Vitals:   07/27/13 1548 07/27/13 1648 07/27/13 2127 07/28/13 0455  BP:  144/91 156/67 173/83  Pulse:  67 84 61  Temp:  98.1 F (36.7 C) 97.7 F (36.5 C) 97.8 F (36.6 C)  TempSrc:  Oral Oral Oral  Resp: '16 16 16 16  ' Height:  '5\' 2"'  (1.575 m)    Weight:  56.246 kg (124 lb)  56.201 kg (123 lb 14.4 oz)  SpO2:  90% 97% 95%    Weight change:  Filed Weights   07/26/13 0415 07/27/13 1648 07/28/13 0455  Weight: 55.3 kg (121 lb 14.6 oz) 56.246 kg (124 lb) 56.201 kg (123 lb 14.4 oz)   Body mass index is 22.66 kg/(m^2).   Gen Exam: Awake and alert with clear speech.  Neck: Supple, No JVD.   Chest: B/L Clear.   CVS: S1 S2 Regular, no murmurs.  Abdomen: soft, BS +, non tender, non distended.  Extremities: no edema, lower extremities warm to touch. Neurologic: Non Focal.   Skin: No Rash.   Wounds: N/A.   Intake/Output from previous day:  Intake/Output Summary (Last 24 hours) at 07/28/13 0933 Last data filed at 07/27/13 1500  Gross per 24 hour  Intake    820 ml  Output      0 ml  Net    820 ml     LAB RESULTS: CBC  Recent Labs Lab 07/22/13 1648 07/23/13 0339 07/24/13 0310 07/25/13 0259  WBC 4.3 3.6* 6.7 6.7  HGB 12.3 9.7* 9.6* 9.7*  HCT 36.1 29.1* 28.7* 27.8*  PLT 189 166 136* 140*  MCV 95.0 95.4 94.4 93.3  MCH 32.4 31.8 31.6 32.6  MCHC 34.1 33.3 33.4 34.9  RDW 12.2 12.5 12.7 12.2  LYMPHSABS 0.6*  --  1.3  --   MONOABS 0.1  --  0.2  --   EOSABS 0.2  --  0.2  --   BASOSABS 0.0  --  0.0  --     Chemistries   Recent Labs Lab  07/23/13 0339 07/23/13 1905 07/24/13 0310 07/25/13 0259 07/28/13 0615  NA 139 134* 138 138 141  K 3.8 3.9 3.9 4.0 3.5*  CL 112 109 105 103 109  CO2 15* 16* 21 20 18*  GLUCOSE 86 78 82 89 155*  BUN 8 6 5* 3* 14  CREATININE 0.66 0.74 0.63 0.58 0.50  CALCIUM 7.4* 7.6* 7.4* 7.9* 7.8*  MG  --   --   --   --  1.6    CBG:  Recent Labs Lab 07/22/13 1956 07/27/13 1613  GLUCAP 85 191*    GFR Estimated Creatinine Clearance: 84.3 ml/min (by C-G formula based on Cr of 0.5).  Coagulation profile  Recent Labs Lab 07/23/13 0339  INR 1.16    Cardiac Enzymes  Recent Labs Lab 07/24/13 0310  TROPONINI <0.30    No components found  with this basename: POCBNP,  No results found for this basename: DDIMER,  in the last 72 hours No results found for this basename: HGBA1C,  in the last 72 hours No results found for this basename: CHOL, HDL, LDLCALC, TRIG, CHOLHDL, LDLDIRECT,  in the last 72 hours No results found for this basename: TSH, T4TOTAL, FREET3, T3FREE, THYROIDAB,  in the last 72 hours No results found for this basename: VITAMINB12, FOLATE, FERRITIN, TIBC, IRON, RETICCTPCT,  in the last 72 hours No results found for this basename: LIPASE, AMYLASE,  in the last 72 hours  Urine Studies No results found for this basename: UACOL, UAPR, USPG, UPH, UTP, UGL, UKET, UBIL, UHGB, UNIT, UROB, ULEU, UEPI, UWBC, URBC, UBAC, CAST, CRYS, UCOM, BILUA,  in the last 72 hours  MICROBIOLOGY: Recent Results (from the past 240 hour(s))  URINE CULTURE     Status: None   Collection Time    07/22/13  4:58 PM      Result Value Ref Range Status   Specimen Description URINE, CLEAN CATCH   Final   Special Requests NONE   Final   Culture  Setup Time     Final   Value: 07/23/2013 00:57     Performed at [facility]     Final   Value: NO GROWTH     Performed at [facility]   Culture     Final   Value: NO GROWTH     Performed at [facility]   Report  Status 07/23/2013 FINAL   Final  CULTURE, BLOOD (ROUTINE X 2)     Status: None   Collection Time    07/22/13  7:58 PM      Result Value Ref Range Status   Specimen Description BLOOD ARM RIGHT   Final   Special Requests BOTTLES DRAWN AEROBIC AND ANAEROBIC 5CC   Final   Culture  Setup Time     Final   Value: 07/23/2013 04:12     Performed at [facility]   Culture     Final   Value:        BLOOD CULTURE RECEIVED NO GROWTH TO DATE CULTURE WILL BE HELD FOR 5 DAYS BEFORE ISSUING A FINAL NEGATIVE REPORT     Performed at [facility]   Report Status PENDING   Incomplete  CULTURE, BLOOD (ROUTINE X 2)     Status: None   Collection Time    07/22/13  8:10 PM      Result Value Ref Range Status   Specimen Description BLOOD ARM RIGHT   Final   Special Requests BOTTLES DRAWN AEROBIC AND ANAEROBIC 10CC   Final   Culture  Setup Time     Final   Value: 07/23/2013 04:12     Performed at [facility]   Culture     Final   Value:        BLOOD CULTURE RECEIVED NO GROWTH TO DATE CULTURE WILL BE HELD FOR 5 DAYS BEFORE ISSUING A FINAL NEGATIVE REPORT     Performed at [facility]   Report Status PENDING   Incomplete  MRSA PCR SCREENING     Status: None   Collection Time    07/23/13 12:00 AM      Result Value Ref Range Status   MRSA by PCR NEGATIVE  NEGATIVE Final   Comment:            The GeneXpert MRSA Assay (FDA  approved for NASAL specimens     only), is one component of a     comprehensive MRSA colonization     surveillance program. It is not     intended to diagnose MRSA     infection nor to guide or     monitor treatment for     MRSA infections.  RESPIRATORY VIRUS PANEL     Status: None   Collection Time    07/23/13  8:06 PM      Result Value Ref Range Status   Source - RVPAN NASAL SWAB   Corrected   Comment: CORRECTED ON 04/22 AT 1814: PREVIOUSLY REPORTED AS NASAL SWAB   Respiratory Syncytial Virus A NOT DETECTED   Final   Respiratory Syncytial Virus  B NOT DETECTED   Final   Influenza A NOT DETECTED   Final   Influenza B NOT DETECTED   Final   Parainfluenza 1 NOT DETECTED   Final   Parainfluenza 2 NOT DETECTED   Final   Parainfluenza 3 NOT DETECTED   Final   Metapneumovirus NOT DETECTED   Final   Rhinovirus NOT DETECTED   Final   Adenovirus NOT DETECTED   Final   Influenza A H1 NOT DETECTED   Final   Influenza A H3 NOT DETECTED   Final   Comment: (NOTE)           Normal Reference Range for each Analyte: NOT DETECTED     Testing performed using the Luminex xTAG Respiratory Viral Panel test     kit.     This test was developed and its performance characteristics determined     by [facility]. It has not been cleared or approved by the [place]     Food and Drug Administration. This test is used for clinical purposes.     It should not be regarded as investigational or for research. This     laboratory is certified under the [place] (CLIA) as qualified to perform high complexity     clinical laboratory testing.     Performed at [place], BLOOD (ROUTINE X 2)     Status: None   Collection Time    07/23/13  8:55 PM      Result Value Ref Range Status   Specimen Description BLOOD RIGHT HAND   Final   Special Requests     Final   Value: BOTTLES DRAWN AEROBIC AND ANAEROBIC 10CC BLUE,5CC RED   Culture  Setup Time     Final   Value: 07/24/2013 00:16     Performed at [facility]   Culture     Final   Value:        BLOOD CULTURE RECEIVED NO GROWTH TO DATE CULTURE WILL BE HELD FOR 5 DAYS BEFORE ISSUING A FINAL NEGATIVE REPORT     Performed at [facility]   Report Status PENDING   Incomplete  CULTURE, BLOOD (ROUTINE X 2)     Status: None   Collection Time    07/23/13  9:13 PM      Result Value Ref Range Status   Specimen Description BLOOD RIGHT UPPER ARM   Final   Special Requests     Final   Value: BOTTLES DRAWN AEROBIC AND ANAEROBIC 10CC BLUE,4CC  RED   Culture  Setup Time     Final   Value: 07/24/2013 00:28     Performed at  [facility]     Final   Value:        BLOOD CULTURE RECEIVED NO GROWTH TO DATE CULTURE WILL BE HELD FOR 5 DAYS BEFORE ISSUING A FINAL NEGATIVE REPORT     Performed at [facility]   Report Status PENDING   Incomplete  GC/CHLAMYDIA PROBE AMP     Status: None   Collection Time    07/24/13  4:03 AM      Result Value Ref Range Status   CT Probe RNA NEGATIVE  NEGATIVE Final   GC Probe RNA NEGATIVE  NEGATIVE Final   Comment: (NOTE)                                                                                               **Normal Reference Range: Negative**          Assay performed using the Gen-Probe APTIMA COMBO2 (R) Assay.     Acceptable specimen types for this assay include APTIMA Swabs (Unisex,     endocervical, urethral, or vaginal), first void urine, and ThinPrep     liquid based cytology samples.     Performed at [place] STUDIES/RESULTS: Ct Chest W Contrast  07/25/2013   CLINICAL DATA:  Parenchymal lung disease noted on recent MRI  EXAM: CT CHEST WITH CONTRAST  TECHNIQUE: Multidetector CT imaging of the chest was performed during intravenous contrast administration.  CONTRAST:  88m OMNIPAQUE IOHEXOL 300 MG/ML  SOLN  COMPARISON:  Cardiac MRI July 24, 2013  FINDINGS: There are bilateral pleural effusions. There is extensive airspace consolidation throughout the lungs bilaterally, predominantly in a perihilar distribution. There is no appreciable interstitial edema. There is no appreciable thoracic adenopathy. The pericardium is not thickened.  In the visualized upper abdomen, there is fatty liver. Visualized upper abdominal structures otherwise appear normal. There are no blastic or lytic bone lesions. Thyroid appears normal.  IMPRESSION: Widespread alveolar opacities bilaterally with moderate pleural effusions bilaterally. This appearance would be  consistent with congestive heart failure. If patient does not have other evidence of congestive heart failure, other etiologies must be considered including widespread pneumonia, autoimmune disease, or allergic type phenomenon of acute nature. There is involvement of all lobes in segments to a varying degree with this alveolar opacity, although these findings are primarily in a perihilar distribution. These findings may well warrant pulmonary consultation with consideration for bronchoscopy. Sputum cytology also could be helpful to further assess.   Electronically Signed   By: WLowella GripM.D.   On: 07/25/2013 10:29   Ct Abdomen Pelvis W Contrast  07/22/2013   CLINICAL DATA:  Persistent fever, body aches, nausea and vomiting after having an abscess lanced. On multiple antibiotics.  EXAM: CT ABDOMEN AND PELVIS WITH CONTRAST  TECHNIQUE: Multidetector CT imaging of the abdomen and pelvis was performed using the standard protocol following bolus administration of intravenous contrast.  CONTRAST:  830mOMNIPAQUE IOHEXOL 300 MG/ML  SOLN  COMPARISON:  Prior CT abdomen/ pelvis 09/27/2012  FINDINGS: Lower Chest: Mild dependent atelectasis. Visualized cardiac structures are within normal limits for  size. No pericardial effusion. Unremarkable visualized distal thoracic esophagus.  Abdomen: Unremarkable CT appearance of the stomach, duodenum, spleen, adrenal glands, pancreas and liver. Gallbladder is unremarkable. No intra or extrahepatic biliary ductal dilatation. Unremarkable appearance of the bilateral kidneys. No focal solid lesion, hydronephrosis or nephrolithiasis.  No evidence of obstruction or focal bowel wall thickening. Normal appendix in the right lower quadrant. The terminal ileum is unremarkable.  Pelvis: Crenated peripherally enhancing cyst in the right ovary likely represents a small corpus luteal cyst or recently ruptured cyst. There is a moderate amount of free fluid in the cul-de-sac of [place] and  around the right adnexa.  Bones/Soft Tissues: No acute fracture or aggressive appearing lytic or blastic osseous lesion.  Vascular: No significant atherosclerotic vascular disease, aneurysmal dilatation or acute abnormality.  IMPRESSION: 1. Probable recent rupture of right ovarian cyst versus recent right sided ovulation. Moderate amount of free fluid in the cul-de-sac of [place] and surrounding the right adnexa. 2. Otherwise, unremarkable appearance of the abdomen and pelvis.   Electronically Signed   By: Jacqulynn Cadet M.D.   On: 07/22/2013 21:39   Dg Chest Port 1 View  07/27/2013   CLINICAL DATA:  Infiltrates  EXAM: PORTABLE CHEST - 1 VIEW  COMPARISON:  Prior chest x-ray 07/22/2013; prior chest CT 07/25/2013  FINDINGS: Comparing the scout view from the recent prior CT scan of the chest, there is been significant interval improvement in the appearance of diffuse bilateral interstitial and airspace opacities. There is some residual focal opacity in the left retrocardiac region. Persistent small bilateral layering pleural effusions. Cardiac and mediastinal contours are stable. No pneumothorax. No acute osseous abnormality.  IMPRESSION: Significant interval improvement in diffuse bilateral interstitial and airspace opacities compared to chest CT dated 07/25/2013.  Persistent left retrocardiac opacity may reflect residual atelectasis or infiltrate.  Small bilateral pleural effusions.   Electronically Signed   By: Jacqulynn Cadet M.D.   On: 07/27/2013 09:34   Dg Chest Port 1 View  (if Code Sepsis Called)  07/22/2013   CLINICAL DATA:  Body aches, short of breath  EXAM: PORTABLE CHEST - 1 VIEW  COMPARISON:  CT abdomen/ pelvis obtained concurrently  FINDINGS: Low inspiratory volumes. Extensive metallic artifact overlies the chest from multiple cardiac leads. No focal airspace consolidation, pneumothorax or large effusion. Cardiac mediastinal contours within normal limits. No acute osseous abnormality.   IMPRESSION: No active disease.  Low lung volumes.   Electronically Signed   By: Jacqulynn Cadet M.D.   On: 07/22/2013 21:43   Mr Card Morphology Wo/w Cm  07/24/2013   CLINICAL DATA:  Possible left atrial mass on echo  EXAM: CARDIAC MRI  TECHNIQUE: The patient was scanned on a 1.5 Tesla GE magnet. A dedicated cardiac coil was used. Functional imaging was done using Fiesta sequences. 2,3, and 4 chamber views were done to assess for RWMA's. Modified Simpson's rule using a short axis stack was used to calculate an ejection fraction on a dedicated work Conservation officer, nature. The patient received 18 cc of Multihance. After 10 minutes inversion recovery sequences were used to assess for infiltration and scar tissue.  CONTRAST:  18 cc Multihance  FINDINGS: Difficult study due to respiratory artifact. Patient had significant exertional dyspnea making breath holds challenging. Views of the lung fields showed bilateral right > left small pleural effusions. There was extensive airspace disease bilaterally, predominantly in the lower lobes and in the peri-hilar area.  The left ventricle was normal size and systolic function, EF 81%.  Normal wall motion and normal wall thickness. The right ventricle was normal in size and systolic function. Normal left and right atrial sizes. There was no left atrial mass lesion. I suspect that the structure seen by echo was an enfolding of the left atrial wall. There was mild tricuspid regurgitation. There did not appear to be significant mitral regurgitation. No aortic stenosis or significant regurgitation.  On delayed enhancement imaging, there was no myocardial delayed enhancement.  MEASUREMENTS: MEASUREMENTS LV EDV 124 mL  LV SV 70 mL  LV EF 57%  IMPRESSION: 1. Significant bilateral airspace disease noted on views of lung fields. Should have clinical followup and dedicated imaging.  2. No left atrial mass was noted. The echo finding was likely an enfolding of the left atrial  wall.  3.  Normal LV and RV size and systolic function.  Dalton Mclean   Electronically Signed   By: Loralie Champagne M.D.   On: 07/24/2013 15:20    Shanker Kristeen Mans, MD  [facility] Pager:[phone]  If 7PM-7AM, please contact night-coverage [email or web address] Password [facility] 07/28/2013, 9:33 AM   LOS: 6 days

## 2013-07-29 ENCOUNTER — Inpatient Hospital Stay (HOSPITAL_COMMUNITY): Payer: Medicaid Other

## 2013-07-29 ENCOUNTER — Encounter (HOSPITAL_COMMUNITY): Payer: Self-pay | Admitting: Student

## 2013-07-29 LAB — CULTURE, BLOOD (ROUTINE X 2)
CULTURE: NO GROWTH
Culture: NO GROWTH

## 2013-07-29 LAB — BASIC METABOLIC PANEL
BUN: 10 mg/dL (ref 6–23)
CHLORIDE: 103 meq/L (ref 96–112)
CO2: 22 meq/L (ref 19–32)
Calcium: 8.2 mg/dL — ABNORMAL LOW (ref 8.4–10.5)
Creatinine, Ser: 0.46 mg/dL — ABNORMAL LOW (ref 0.50–1.10)
GFR calc Af Amer: >90 mL/min (ref >90)
GFR calc non Af Amer: >90 mL/min (ref >90)
GLUCOSE: 193 mg/dL — AB (ref 70–99)
POTASSIUM: 3.4 meq/L — AB (ref 3.7–5.3)
SODIUM: 138 meq/L (ref 137–147)

## 2013-07-29 LAB — GLUCOSE, CAPILLARY
GLUCOSE-CAPILLARY: 175 mg/dL — AB (ref 70–99)
GLUCOSE-CAPILLARY: 163 mg/dL — AB (ref 70–99)

## 2013-07-29 LAB — MAGNESIUM: Magnesium: 2.1 mg/dL (ref 1.5–2.5)

## 2013-07-29 MED ORDER — POTASSIUM CHLORIDE CRYS ER 20 MEQ PO TBCR
40.0000 meq | EXTENDED_RELEASE_TABLET | Freq: Once | ORAL | Status: AC
  Administered 2013-07-29: 40 meq via ORAL
  Filled 2013-07-29: qty 2

## 2013-07-29 MED ORDER — ONDANSETRON 4 MG PO TBDP: 4.0000 mg | ORAL_TABLET | Freq: Three times a day (TID) | ORAL | Status: DC | PRN

## 2013-07-29 MED ORDER — AMLODIPINE BESYLATE 5 MG PO TABS: 5.0000 mg | ORAL_TABLET | Freq: Every day | ORAL | Status: DC

## 2013-07-29 MED ORDER — LEVOFLOXACIN 750 MG PO TABS: 750.0000 mg | ORAL_TABLET | Freq: Every day | ORAL | Status: AC

## 2013-07-29 MED ORDER — LEVOFLOXACIN 750 MG PO TABS
750.0000 mg | ORAL_TABLET | ORAL | Status: DC
  Filled 2013-07-29: qty 1

## 2013-07-29 MED ORDER — PREDNISONE 20 MG PO TABS: 40.0000 mg | ORAL_TABLET | Freq: Every day | ORAL | Status: DC

## 2013-07-29 NOTE — Progress Notes (Signed)
NUTRITION FOLLOW-UP  DOCUMENTATION CODES Per approved criteria  -Not Applicable   INTERVENTION: Discontinue Ensure Complete po BID. RD to follow for nutrition care plan.  NUTRITION DIAGNOSIS: Inadequate oral intake related to poor appetite as evidenced by patient report - resolved.  Goal: Pt to meet >/= 90% of their estimated nutrition needs - met.  Monitor:  PO & supplemental intake, weight, labs, I/O's  ASSESSMENT: Patient with PMH of chronic abdominal pain, smoking and migraines; presented with complaints of fever and generalized pain; recently had been seen in ED for skin abscess on gluteal region. Pt found to have presumed CAP and extensive bilateral pulmonary infiltrates.  Pulmonary issues have improved with steroids.  Ordered for Regular diet with Ensure Complete PO BID. Intake has improved, pt is eating 50-90% of meals. Pt is refusing Ensure - will discontinue.  Weight stable since admission.  Potassium low at 3.4 CBG's: 157 - 210  Height: Ht Readings from Last 1 Encounters:  07/27/13 '5\' 2"'  (1.575 m)    Weight: Wt Readings from Last 1 Encounters:  07/29/13 121 lb 6.4 oz (55.067 kg)  Admit wt 120 lb - stable  BMI:  Body mass index is 22.2 kg/(m^2). WNL  Estimated Nutritional Needs: Kcal: 1500-1700 Protein: 70-80 gm Fluid: 1.5-1.7 L  Skin: L coccyx incision  Diet Order: General    Intake/Output Summary (Last 24 hours) at 07/29/13 1200 Last data filed at 07/29/13 0900  Gross per 24 hour  Intake    836 ml  Output      0 ml  Net    836 ml    Last BM: 4/25  Labs:   Recent Labs Lab 07/25/13 0259 07/28/13 0615 07/29/13 0639  NA 138 141 138  K 4.0 3.5* 3.4*  CL 103 109 103  CO2 20 18* 22  BUN 3* 14 10  CREATININE 0.58 0.50 0.46*  CALCIUM 7.9* 7.8* 8.2*  MG  --  1.6 2.1  GLUCOSE 89 155* 193*    CBG (last 3)   Recent Labs  07/28/13 1718 07/28/13 2217 07/29/13 0543  GLUCAP 157* 210* 175*    Scheduled Meds: . amLODipine  5 mg  Oral Daily  . enoxaparin (LOVENOX) injection  40 mg Subcutaneous Q24H  . feeding supplement (ENSURE COMPLETE)  237 mL Oral BID BM  . hydrOXYzine  25 mg Oral TID  . ibuprofen  800 mg Oral TID  . levofloxacin  750 mg Oral Q24H  . methylPREDNISolone (SOLU-MEDROL) injection  40 mg Intravenous Q12H  . sodium chloride  3 mL Intravenous Q12H    Continuous Infusions:    Inda Coke MS, RD, LDN Inpatient Registered Dietitian Pager: [phone] After-hours pager: [phone]

## 2013-07-29 NOTE — Progress Notes (Signed)
Name: Madison Copeland MRN: [id number] DOB: [birth year]    ADMISSION DATE:  07/22/2013 CONSULTATION DATE:  07/25/13   REFERRING MD :  Sherral Hammers   CHIEF COMPLAINT:  bilat pulm infiltrates, dyspnea   BRIEF PATIENT DESCRIPTION:  28 y/o female smoker with hx of asthma was recently started on bactrim/keflex for pilonidal cyst.  She was seen in ER on 4/17 with respiratory viral prodrome.  She was admitted with fever, malaise, myalgia, and hypotension.  Had progressive dyspnea, and CT chest showed b/l pulmonary infiltrates.  PCCM consulted to further assess.  SIGNIFICANT EVENTS: 4/17 ER visit for viral prodrome 4/20 Admit 4/23 PCCM consulted, start high dose steroids  STUDIES:  UDS 4/20 >> negative CXR 4/20 >> negative Echo 4/21 >> EF 65 to 70%, echodensity in Lt atrial wall 12 x 11 mm Labs 4/21 >> ANA negative, ESR 5, RF < 10, HIV negative Cardiac MRI 4/22 >> b/l ASD, no atrial mass UDS 4/22 >> negative CT chest 4/23>>> predominantly hilar b/l ASD, small b/l effusions Acute hepatitis panel 4/23 >> negative  LINES / TUBES: PIV  CULTURES: BCx2 4/21>>> Influenza PCR 4/21 >> negative GC/chlam (urine) 4/22>>> negative Urine 4/21>>> negative Mono 4/21>>> negative Respiratory viral panel 4/24 >>>neg  ANTIBIOTICS: Levaquin 4/22>>>  SUBJECTIVE: Pt hopeful for discharge, no acute events   VITAL SIGNS: Temp:  [97.6 F (36.4 C)-97.9 F (36.6 C)] 97.6 F (36.4 C) (04/27 0547) Pulse Rate:  [53-62] 53 (04/27 0547) Resp:  [18] 18 (04/27 0547) BP: (132-151)/(63-87) 147/70 mmHg (04/27 0547) SpO2:  [94 %-99 %] 99 % (04/27 0547) Weight:  [121 lb 6.4 oz (55.067 kg)] 121 lb 6.4 oz (55.067 kg) (04/27 0547)  PHYSICAL EXAMINATION: General: wd female in nad Neuro: alert, moves all 4. HEENT: no purulence or d/c from nares, no LN or TMG Cardiovascular: regular, no murmur Lungs:  fine crackles in bases Abdomen:  Soft, non tender Musculoskeletal: no edema  CBC No results found for this  basename: WBC, HGB, HCT, PLT,  in the last 72 hours BMET Recent Labs     07/28/13  0615  07/29/13  0639  NA  141  138  K  3.5*  3.4*  CL  109  103  CO2  18*  22  BUN  14  10  CREATININE  0.50  0.46*  GLUCOSE  155*  193*   Imaging Dg Chest 2 View  07/29/2013   CLINICAL DATA:  COPD/pneumonia  EXAM: CHEST  2 VIEW  COMPARISON:  07/27/2013  FINDINGS: Improving streaky bibasilar airspace process. Left hemidiaphragm remains obscured. Trace left effusion noted. Tiny right effusion has resolved. No pneumothorax. Normal heart size and vascularity. No superimposed edema.  IMPRESSION: Improving bibasilar airspace process/pneumonia. Residual trace left effusion.   Electronically Signed   By: Daryll Brod M.D.   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  She has improved clinically and by xray with steroids.  ANA and RA factor neg, CRP elevated at 3.2 however, ESR only 5.  Influenza and Resp Virus panel negative.  HIV negative.  Former smoker, hx childhood asthma.    PLAN: -discharge on 40 mg oral prednisone without titration until seen in Pulmonary Office -follow up with Dr. Lake Bells on 5/13 with repeat cxr (arranged) -complete 8 days total abx -follow up auto-immune panel in office   Noe Gens, NP-C [facility] Pgr: [phone] or [phone]  Patient seen  and examined, agree with above note.  I dictated the care and orders written for this patient under my direction.  Rush Farmer, MD [phone]

## 2013-07-29 NOTE — Progress Notes (Signed)
DC home with family, verbally understood DC papers, no questions ask

## 2013-07-29 NOTE — Discharge Summary (Signed)
Physician Discharge Summary  Madison Copeland VZC:[id number] DOB: 15-Feb-1986 DOA: 07/22/2013  PCP: No PCP Per Patient  Admit date: 07/22/2013 Discharge date: 07/29/2013  Time spent: 60 minutes  Recommendations for Outpatient Follow-up:  1. Follow up With [facility] on 08/14/13 for CXR  Discharge Diagnoses:  Principal Problem:   Cryptogenic organizing pneumonia Active Problems:   Hypotension   Ovarian cyst rupture   Abdominal pain   Hypoxemia   Discharge Condition: Improved, stable  Diet recommendation: Low sodium, heart healthy  Filed Weights   07/27/13 1648 07/28/13 0455 07/29/13 0547  Weight: 56.246 kg (124 lb) 56.201 kg (123 lb 14.4 oz) 55.067 kg (121 lb 6.4 oz)    History of present illness:  28 y/o F with Hx pertinent for asthma, smoking, uterine fibroids, and abdominal pain presented to the ED with fever, chills n/v, and myalgias x 1 week. She was then being treated for a pilonidal cyst (07/14/13) on Keflex and Bactrim, then for UTI (07/19/13), and then on 07/22/13 for above symptoms, ongoing for 1 week. Pt was hypotensive (SBP low 70s), febrile(101) and tachycardic(120s) in the ED with progressive difficulty breathing, was admitted for further work up and treatment.  Hospital Course:  Principal Problem:   Cryptogenic organizing pneumonia - admitted with fever/myalgia's and a normal chest xray on 4/20, -further work up including CT chest/repeat CXR showed extensive bilat pulm infiltrates.ANA and RA factor neg,CRP elevated at 3.2 however ESR only 5. Influenza and Resp Virus panel negative.HIV was also negative. Subsequently PCCM consulted, current working diagnoses is Cryptogenic Organizing PNA -Began on IV Solu-Medrol and intravenous Levaquin with marked improvement, chest x-ray done on 4/27 shows significant improvement. Pulmonology recommending to D/c on Prednisone 4m till follow up appointment, Finish 1 more day of Levaquin, total of 8 days.  Active Problems:    Hypotension -Resolved with IVF and treatment of COP -UA was clean, urine and blood cultures negative -CT showed no acute processes    Hypertension  -D/c Lisinopril due to coughing -began Amlodipine for BP    Ovarian cyst rupture -LLQ history and cyst in same location as rupture site last year per CT, unknown time frame -No further needed unless symptomatic    Abdominal pain -Likely secondary to above, no current abdominal pain -Acute hepatitis panel negative -Follow up with GYN or Dr. SFuller Planto evaluate further as outpatient    Consultations:  PCCM  Discharge Exam: Filed Vitals:   07/29/13 1345  BP: 132/74  Pulse: 66  Temp: 98.3 F (36.8 C)  Resp: 20    General: WD/WN female, NAD   Neuro: CAOx3, + PMSx4.  HEENT: anicteric sclera, PERRL,  No nasal drainage  Cardiovascular: RRR, no m/r/g  Lungs: breath sounds clear and equal, no retractions or accessory muscle use Abdomen: Soft, non tender  Musculoskeletal: no edema   Discharge Instructions      Discharge Orders   Future Appointments Provider Department Dept Phone   08/12/2013 2:00 PM [phone]  08/12/2013 3:00 PM [phone]  08/14/2013 2:00 PM DJuanito Doom MD [facility] [phone]  Future Orders Complete By Expires   Diet - low sodium heart healthy  As directed    Diet - low sodium heart healthy  As directed    Increase activity slowly  As directed    Increase activity slowly  As directed        Medication  List    STOP taking these medications       cephALEXin 500 MG capsule  Commonly known as:  KEFLEX     HYDROcodone-acetaminophen 5-325 MG per tablet  Commonly known as:  NORCO/VICODIN     ibuprofen 200 MG tablet  Commonly known as:  ADVIL,MOTRIN     sulfamethoxazole-trimethoprim 800-160 MG per tablet  Commonly known as:  BACTRIM DS       TAKE these medications       amLODipine 5 MG tablet  Commonly known as:  NORVASC  Take 1 tablet (5 mg total) by mouth daily.     levofloxacin 750 MG tablet  Commonly known as:  LEVAQUIN  Take 1 tablet (750 mg total) by mouth daily.  Start taking on:  07/30/2013     ondansetron 4 MG disintegrating tablet  Commonly known as:  ZOFRAN ODT  Take 1 tablet (4 mg total) by mouth every 8 (eight) hours as needed for nausea or vomiting.     predniSONE 20 MG tablet  Commonly known as:  DELTASONE  Take 2 tablets (40 mg total) by mouth daily.  Start taking on:  07/30/2013       Allergies  Allergen Reactions  . Zofran [Ondansetron Hcl]     Redness of face   Follow-up Information   Follow up with Dr. Roselie Awkward On 08/14/2013. (Appt at 2:00 PM.  Arrive at 1:30 for chest xray prior to appointment. )    Contact information:   [facility] [address] [place], [place] [address]  Office is across the road from [facility].        Follow up with [facility]     On 08/12/2013. (2 pm for orange card application and 3 pm for hospital follow up)    Contact information:   [place] [address] [phone]       The results of significant diagnostics from this hospitalization (including imaging, microbiology, ancillary and laboratory) are listed below for reference.    Significant Diagnostic Studies: Dg Chest 2 View  07/29/2013   CLINICAL DATA:  COPD/pneumonia  EXAM: CHEST  2 VIEW  COMPARISON:  07/27/2013  FINDINGS: Improving streaky bibasilar airspace process. Left hemidiaphragm remains obscured. Trace left effusion noted. Tiny right effusion has resolved. No pneumothorax. Normal heart size and vascularity. No superimposed edema.  IMPRESSION: Improving bibasilar airspace process/pneumonia. Residual trace left effusion.   Electronically Signed   By: Daryll Brod M.D.   On: 07/29/2013 08:02    Ct Chest W Contrast  07/25/2013   CLINICAL DATA:  Parenchymal lung disease noted on recent MRI  EXAM: CT CHEST WITH CONTRAST  TECHNIQUE: Multidetector CT imaging of the chest was performed during intravenous contrast administration.  CONTRAST:  9m OMNIPAQUE IOHEXOL 300 MG/ML  SOLN  COMPARISON:  Cardiac MRI July 24, 2013  FINDINGS: There are bilateral pleural effusions. There is extensive airspace consolidation throughout the lungs bilaterally, predominantly in a perihilar distribution. There is no appreciable interstitial edema. There is no appreciable thoracic adenopathy. The pericardium is not thickened.  In the visualized upper abdomen, there is fatty liver. Visualized upper abdominal structures otherwise appear normal. There are no blastic or lytic bone lesions. Thyroid appears normal.  IMPRESSION: Widespread alveolar opacities bilaterally with moderate pleural effusions bilaterally. This appearance would be consistent with congestive heart failure. If patient does not have other evidence of congestive heart failure, other etiologies must be  considered including widespread pneumonia, autoimmune disease, or allergic type phenomenon of acute nature. There is involvement of all lobes in segments to a varying degree with this alveolar opacity, although these findings are primarily in a perihilar distribution. These findings may well warrant pulmonary consultation with consideration for bronchoscopy. Sputum cytology also could be helpful to further assess.   Electronically Signed   By: Lowella Grip M.D.   On: 07/25/2013 10:29   Ct Abdomen Pelvis W Contrast  07/22/2013   CLINICAL DATA:  Persistent fever, body aches, nausea and vomiting after having an abscess lanced. On multiple antibiotics.  EXAM: CT ABDOMEN AND PELVIS WITH CONTRAST  TECHNIQUE: Multidetector CT imaging of the abdomen and pelvis was performed using the standard protocol following bolus administration of intravenous contrast.  CONTRAST:   46m OMNIPAQUE IOHEXOL 300 MG/ML  SOLN  COMPARISON:  Prior CT abdomen/ pelvis 09/27/2012  FINDINGS: Lower Chest: Mild dependent atelectasis. Visualized cardiac structures are within normal limits for size. No pericardial effusion. Unremarkable visualized distal thoracic esophagus.  Abdomen: Unremarkable CT appearance of the stomach, duodenum, spleen, adrenal glands, pancreas and liver. Gallbladder is unremarkable. No intra or extrahepatic biliary ductal dilatation. Unremarkable appearance of the bilateral kidneys. No focal solid lesion, hydronephrosis or nephrolithiasis.  No evidence of obstruction or focal bowel wall thickening. Normal appendix in the right lower quadrant. The terminal ileum is unremarkable.  Pelvis: Crenated peripherally enhancing cyst in the right ovary likely represents a small corpus luteal cyst or recently ruptured cyst. There is a moderate amount of free fluid in the cul-de-sac of Douglas and around the right adnexa.  Bones/Soft Tissues: No acute fracture or aggressive appearing lytic or blastic osseous lesion.  Vascular: No significant atherosclerotic vascular disease, aneurysmal dilatation or acute abnormality.  IMPRESSION: 1. Probable recent rupture of right ovarian cyst versus recent right sided ovulation. Moderate amount of free fluid in the cul-de-sac of Douglas and surrounding the right adnexa. 2. Otherwise, unremarkable appearance of the abdomen and pelvis.   Electronically Signed   By: HJacqulynn CadetM.D.   On: 07/22/2013 21:39   Dg Chest Port 1 View  07/27/2013   CLINICAL DATA:  Infiltrates  EXAM: PORTABLE CHEST - 1 VIEW  COMPARISON:  Prior chest x-ray 07/22/2013; prior chest CT 07/25/2013  FINDINGS: Comparing the scout view from the recent prior CT scan of the chest, there is been significant interval improvement in the appearance of diffuse bilateral interstitial and airspace opacities. There is some residual focal opacity in the left retrocardiac region. Persistent small  bilateral layering pleural effusions. Cardiac and mediastinal contours are stable. No pneumothorax. No acute osseous abnormality.  IMPRESSION: Significant interval improvement in diffuse bilateral interstitial and airspace opacities compared to chest CT dated 07/25/2013.  Persistent left retrocardiac opacity may reflect residual atelectasis or infiltrate.  Small bilateral pleural effusions.   Electronically Signed   By: HJacqulynn CadetM.D.   On: 07/27/2013 09:34   Dg Chest Port 1 View  (if Code Sepsis Called)  07/22/2013   CLINICAL DATA:  Body aches, short of breath  EXAM: PORTABLE CHEST - 1 VIEW  COMPARISON:  CT abdomen/ pelvis obtained concurrently  FINDINGS: Low inspiratory volumes. Extensive metallic artifact overlies the chest from multiple cardiac leads. No focal airspace consolidation, pneumothorax or large effusion. Cardiac mediastinal contours within normal limits. No acute osseous abnormality.  IMPRESSION: No active disease.  Low lung volumes.   Electronically Signed   By: HJacqulynn CadetM.D.   On: 07/22/2013 21:43   Mr  Card Morphology Wo/w Cm  07/24/2013   CLINICAL DATA:  Possible left atrial mass on echo  EXAM: CARDIAC MRI  TECHNIQUE: The patient was scanned on a 1.5 Tesla GE magnet. A dedicated cardiac coil was used. Functional imaging was done using Fiesta sequences. 2,3, and 4 chamber views were done to assess for RWMA's. Modified Simpson's rule using a short axis stack was used to calculate an ejection fraction on a dedicated work Conservation officer, nature. The patient received 18 cc of Multihance. After 10 minutes inversion recovery sequences were used to assess for infiltration and scar tissue.  CONTRAST:  18 cc Multihance  FINDINGS: Difficult study due to respiratory artifact. Patient had significant exertional dyspnea making breath holds challenging. Views of the lung fields showed bilateral right > left small pleural effusions. There was extensive airspace disease bilaterally,  predominantly in the lower lobes and in the peri-hilar area.  The left ventricle was normal size and systolic function, EF 16%. Normal wall motion and normal wall thickness. The right ventricle was normal in size and systolic function. Normal left and right atrial sizes. There was no left atrial mass lesion. I suspect that the structure seen by echo was an enfolding of the left atrial wall. There was mild tricuspid regurgitation. There did not appear to be significant mitral regurgitation. No aortic stenosis or significant regurgitation.  On delayed enhancement imaging, there was no myocardial delayed enhancement.  MEASUREMENTS: MEASUREMENTS LV EDV 124 mL  LV SV 70 mL  LV EF 57%  IMPRESSION: 1. Significant bilateral airspace disease noted on views of lung fields. Should have clinical followup and dedicated imaging.  2. No left atrial mass was noted. 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  3.  Normal LV and RV size and systolic function.  Dalton Mclean   Electronically Signed   By: Loralie Champagne M.D.   On: 07/24/2013 15:20    Microbiology: Recent Results (from the past 240 hour(s))  URINE CULTURE     Status: None   Collection Time    07/22/13  4:58 PM      Result Value Ref Range Status   Specimen Description URINE, CLEAN CATCH   Final   Special Requests NONE   Final   Culture  Setup Time     Final   Value: 07/23/2013 00:57     Performed at [facility]     Final   Value: NO GROWTH     Performed at [facility]   Culture     Final   Value: NO GROWTH     Performed at [facility]   Report Status 07/23/2013 FINAL   Final  CULTURE, BLOOD (ROUTINE X 2)     Status: None   Collection Time    07/22/13  7:58 PM      Result Value Ref Range Status   Specimen Description BLOOD ARM RIGHT   Final   Special Requests BOTTLES DRAWN AEROBIC AND ANAEROBIC 5CC   Final   Culture  Setup Time     Final   Value: 07/23/2013 04:12     Performed at  [facility]   Culture     Final   Value: NO GROWTH 5 DAYS     Performed at [facility]   Report Status 07/29/2013 FINAL   Final  CULTURE, BLOOD (ROUTINE X 2)     Status: None   Collection Time  07/22/13  8:10 PM      Result Value Ref Range Status   Specimen Description BLOOD ARM RIGHT   Final   Special Requests BOTTLES DRAWN AEROBIC AND ANAEROBIC 10CC   Final   Culture  Setup Time     Final   Value: 07/23/2013 04:12     Performed at [facility]   Culture     Final   Value: NO GROWTH 5 DAYS     Performed at [facility]   Report Status 07/29/2013 FINAL   Final  MRSA PCR SCREENING     Status: None   Collection Time    07/23/13 12:00 AM      Result Value Ref Range Status   MRSA by PCR NEGATIVE  NEGATIVE Final   Comment:            The GeneXpert MRSA Assay (FDA     approved for NASAL specimens     only), is one component of a     comprehensive MRSA colonization     surveillance program. It is not     intended to diagnose MRSA     infection nor to guide or     monitor treatment for     MRSA infections.  RESPIRATORY VIRUS PANEL     Status: None   Collection Time    07/23/13  8:06 PM      Result Value Ref Range Status   Source - RVPAN NASAL SWAB   Corrected   Comment: CORRECTED ON 04/22 AT 1814: PREVIOUSLY REPORTED AS NASAL SWAB   Respiratory Syncytial Virus A NOT DETECTED   Final   Respiratory Syncytial Virus B NOT DETECTED   Final   Influenza A NOT DETECTED   Final   Influenza B NOT DETECTED   Final   Parainfluenza 1 NOT DETECTED   Final   Parainfluenza 2 NOT DETECTED   Final   Parainfluenza 3 NOT DETECTED   Final   Metapneumovirus NOT DETECTED   Final   Rhinovirus NOT DETECTED   Final   Adenovirus NOT DETECTED   Final   Influenza A H1 NOT DETECTED   Final   Influenza A H3 NOT DETECTED   Final   Comment: (NOTE)           Normal Reference Range for each Analyte: NOT DETECTED     Testing performed using the Luminex xTAG Respiratory  Viral Panel test     kit.     This test was developed and its performance characteristics determined     by [facility]. It has not been cleared or approved by the [place]     Food and Drug Administration. This test is used for clinical purposes.     It should not be regarded as investigational or for research. This     laboratory is certified under the [place] (CLIA) as qualified to perform high complexity     clinical laboratory testing.     Performed at [facility], BLOOD (ROUTINE X 2)     Status: None   Collection Time    07/23/13  8:55 PM      Result Value Ref Range Status   Specimen Description BLOOD RIGHT HAND   Final   Special Requests     Final   Value: BOTTLES DRAWN AEROBIC AND ANAEROBIC 10CC BLUE,5CC RED   Culture  Setup Time  Final   Value: 07/24/2013 00:16     Performed at [facility]   Culture     Final   Value:        BLOOD CULTURE RECEIVED NO GROWTH TO DATE CULTURE WILL BE HELD FOR 5 DAYS BEFORE ISSUING A FINAL NEGATIVE REPORT     Performed at [facility]   Report Status PENDING   Incomplete  CULTURE, BLOOD (ROUTINE X 2)     Status: None   Collection Time    07/23/13  9:13 PM      Result Value Ref Range Status   Specimen Description BLOOD RIGHT UPPER ARM   Final   Special Requests     Final   Value: BOTTLES DRAWN AEROBIC AND ANAEROBIC 10CC BLUE,4CC RED   Culture  Setup Time     Final   Value: 07/24/2013 00:28     Performed at [facility]   Culture     Final   Value:        BLOOD CULTURE RECEIVED NO GROWTH TO DATE CULTURE WILL BE HELD FOR 5 DAYS BEFORE ISSUING A FINAL NEGATIVE REPORT     Performed at [facility]   Report Status PENDING   Incomplete  GC/CHLAMYDIA PROBE AMP     Status: None   Collection Time    07/24/13  4:03 AM      Result Value Ref Range Status   CT Probe RNA NEGATIVE  NEGATIVE Final   GC Probe RNA NEGATIVE  NEGATIVE Final   Comment:  (NOTE)                                                                                               **Normal Reference Range: Negative**          Assay performed using the Gen-Probe APTIMA COMBO2 (R) Assay.     Acceptable specimen types for this assay include APTIMA Swabs (Unisex,     endocervical, urethral, or vaginal), first void urine, and ThinPrep     liquid based cytology samples.     Performed at [facility]: Basic Metabolic Panel:  Recent Labs Lab 07/23/13 1905 07/24/13 0310 07/25/13 0259 07/28/13 0615 07/29/13 0639  NA 134* 138 138 141 138  K 3.9 3.9 4.0 3.5* 3.4*  CL 109 105 103 109 103  CO2 16* 21 20 18* 22  GLUCOSE 78 82 89 155* 193*  BUN 6 5* 3* 14 10  CREATININE 0.74 0.63 0.58 0.50 0.46*  CALCIUM 7.6* 7.4* 7.9* 7.8* 8.2*  MG  --   --   --  1.6 2.1   Liver Function Tests:  Recent Labs Lab 07/22/13 1648 07/23/13 0339 07/24/13 0310 07/25/13 0259  AST 59* 57* 70* 58*  ALT 35 33 43* 46*  ALKPHOS 54 40 45 50  BILITOT 0.5 0.5 0.7 0.9  PROT 7.5 5.6* 5.8* 6.1  ALBUMIN 3.7 2.8* 2.9* 2.9*    Recent Labs Lab 07/23/13 1112  LIPASE 11   CBC:  Recent Labs Lab 07/22/13 1648 07/23/13 0339 07/24/13 0310 07/25/13 0259  WBC  4.3 3.6* 6.7 6.7  NEUTROABS 3.4  --  5.0  --   HGB 12.3 9.7* 9.6* 9.7*  HCT 36.1 29.1* 28.7* 27.8*  MCV 95.0 95.4 94.4 93.3  PLT 189 166 136* 140*   Cardiac Enzymes:  Recent Labs Lab 07/24/13 0310  TROPONINI <0.30   CBG:  Recent Labs Lab 07/27/13 1613 07/28/13 1718 07/28/13 2217 07/29/13 0543 07/29/13 1342  GLUCAP 191* 157* 210* 175* 163*       Signed:  Myrla Halsted PA-S  [facility] 07/29/2013, 2:12 PM  Attending Seen and examined, agree with the above assessment and plan. Much improved, chest x-ray done today shows significant improvement. Spoke with pulmonology, and recommendations are to transition to 40 mg of prednisone until seen by pulmonology. Stable for discharge, long  discussion with patient and her mother at bedside   Nena Alexander MD

## 2013-07-29 NOTE — Discharge Instructions (Signed)
Pneumonia, Adult °Pneumonia is an infection of the lungs. It may be caused by a germ (virus or bacteria). Some types of pneumonia can spread easily from person to person. This can happen when you cough or sneeze. °HOME CARE °· Only take medicine as told by your doctor. °· Take your medicine (antibiotics) as told. Finish it even if you start to feel better. °· Do not smoke. °· You may use a vaporizer or humidifier in your room. This can help loosen thick spit (mucus). °· Sleep so you are almost sitting up (semi-upright). This helps reduce coughing. °· Rest. °A shot (vaccine) can help prevent pneumonia. Shots are often advised for: °· People over 65 years old. °· Patients on chemotherapy. °· People with long-term (chronic) lung problems. °· People with immune system problems. °GET HELP RIGHT AWAY IF:  °· You are getting worse. °· You cannot control your cough, and you are losing sleep. °· You cough up blood. °· Your pain gets worse, even with medicine. °· You have a fever. °· Any of your problems are getting worse, not better. °· You have shortness of breath or chest pain. °MAKE SURE YOU:  °· Understand these instructions. °· Will watch your condition. °· Will get help right away if you are not doing well or get worse. °Document Released: 09/07/2007 Document Revised: 06/13/2011 Document Reviewed: 06/11/2010 °ExitCare® Patient Information ©2014 [employer]. ° °

## 2013-07-29 NOTE — Care Management Note (Signed)
    Page 1 of 1   07/29/2013     2:39:35 PM CARE MANAGEMENT NOTE 07/29/2013  Patient:  Madison Copeland, Madison Copeland   Account Number:  [id number]  Date Initiated:  07/29/2013  Documentation initiated by:  Tomi Bamberger  Subjective/Objective Assessment:   dx fever, weakness, pna  admit- from home with kids.     Action/Plan:   Anticipated DC Date:  07/29/2013   Anticipated DC Plan:  [place]  CM consult  [facility]      Choice offered to / List presented to:             Status of service:  Completed, signed off Medicare Important Message given?   (If response is "NO", the following Medicare IM given date fields will be blank) Date Medicare IM given:   Date Additional Medicare IM given:    Discharge Disposition:  HOME/SELF CARE  Per UR Regulation:  Reviewed for med. necessity/level of care/duration of stay  If discussed at [place] of Stay Meetings, dates discussed:    Comments:  07/29/13 [place], BSN [phone] patient for dc today, she has f/u apt scheduled at the [facility] and [facility] , patient will go over to wellness clinic to get scripts filled at low price for $4.

## 2013-07-30 LAB — CULTURE, BLOOD (ROUTINE X 2)
CULTURE: NO GROWTH
Culture: NO GROWTH

## 2013-08-12 ENCOUNTER — Ambulatory Visit: Payer: Self-pay | Attending: Internal Medicine | Admitting: Internal Medicine

## 2013-08-12 ENCOUNTER — Ambulatory Visit: Payer: Self-pay

## 2013-08-12 VITALS — BP 123/85 | HR 86 | Temp 98.0°F | Resp 16 | Ht 62.0 in | Wt 121.0 lb

## 2013-08-12 DIAGNOSIS — F17200 Nicotine dependence, unspecified, uncomplicated: Secondary | ICD-10-CM | POA: Insufficient documentation

## 2013-08-12 DIAGNOSIS — J189 Pneumonia, unspecified organism: Secondary | ICD-10-CM

## 2013-08-12 DIAGNOSIS — J84116 Cryptogenic organizing pneumonia: Secondary | ICD-10-CM | POA: Insufficient documentation

## 2013-08-12 DIAGNOSIS — R509 Fever, unspecified: Secondary | ICD-10-CM

## 2013-08-12 LAB — GLUCOSE, POCT (MANUAL RESULT ENTRY): POC Glucose: 111 mg/dl — AB (ref 70–99)

## 2013-08-12 LAB — POCT GLYCOSYLATED HEMOGLOBIN (HGB A1C): HEMOGLOBIN A1C: 5

## 2013-08-12 NOTE — Progress Notes (Signed)
Patient ID: Madison Copeland, female   DOB: Dec 10, 1985, 28 y.o.   MRN: [id number]  CC: follow up  HPI: 28 year old female with history of asthma, smoking, uterine fibroids who was recently hospitalized with cryptogenic organizing pneumonia and now comes for followup. Patient had extensive workup while hospitalized including CT chest and chest x-ray which showed extensive bilateral pulmonary infiltrates. Her influenza panel, respiratory virus panel and HIV were negative. She was discharged with prednisone 40 mg until the followup appointment with pulmonary on 08/14/2013. She was also discharged on Levaquin which she has completed at this time.   Allergies  Allergen Reactions  . Zofran [Ondansetron Hcl]     Redness of face   Past Medical History  Diagnosis Date  . Asthma   . Migraine   . Fibroid, uterine     small intramural  . Tobacco abuse   . Anemia     during pregnancy  . Ovarian cyst rupture 07/22/13   Current Outpatient Prescriptions on File Prior to Visit  Medication Sig Dispense Refill  . amLODipine (NORVASC) 5 MG tablet Take 1 tablet (5 mg total) by mouth daily.  30 tablet  0  . ondansetron (ZOFRAN ODT) 4 MG disintegrating tablet Take 1 tablet (4 mg total) by mouth every 8 (eight) hours as needed for nausea or vomiting.  10 tablet  0  . predniSONE (DELTASONE) 20 MG tablet Take 2 tablets (40 mg total) by mouth daily.  60 tablet  0   No current facility-administered medications on file prior to visit.   Family History  Problem Relation Age of Onset  . Diabetes Mother   . Heart disease Mother   . Cervical cancer Maternal Aunt   . Kidney disease Maternal Grandmother    History   Social History  . Marital Status: Married    Spouse Name: N/A    Number of Children: 2  . Years of Education: N/A   Occupational History  . event server    Social History Main Topics  . Smoking status: Current Every Day Smoker -- 0.25 packs/day for 15 years    Types: Cigarettes  . Smokeless  tobacco: Never Used  . Alcohol Use: Yes     Comment: occasion  . Drug Use: No  . Sexual Activity: Not on file   Other Topics Concern  . Not on file   Social History Narrative   Lives in [place]. Works as Chief Operating Officer.     Review of Systems  Constitutional: Negative for fever, chills, diaphoresis, activity change, appetite change and fatigue.  HENT: Negative for ear pain, nosebleeds, congestion, facial swelling, rhinorrhea, neck pain, neck stiffness and ear discharge.   Eyes: Negative for pain, discharge, redness, itching and visual disturbance.  Respiratory: Negative for cough, choking, chest tightness, shortness of breath, wheezing and stridor.   Cardiovascular: Negative for chest pain, palpitations and leg swelling.  Gastrointestinal: Negative for abdominal distention.  Genitourinary: Negative for dysuria, urgency, frequency, hematuria, flank pain, decreased urine volume, difficulty urinating and dyspareunia.  Musculoskeletal: Negative for back pain, joint swelling, arthralgias and gait problem.  Neurological: Negative for dizziness, tremors, seizures, syncope, facial asymmetry, speech difficulty, weakness, light-headedness, numbness and headaches.  Hematological: Negative for adenopathy. Does not bruise/bleed easily.  Psychiatric/Behavioral: Negative for hallucinations, behavioral problems, confusion, dysphoric mood, decreased concentration and agitation.    Objective:  There were no vitals filed for this visit.  Physical Exam  Constitutional: Appears well-developed and well-nourished. No distress.  HENT: Normocephalic. External right and left ear  normal. Oropharynx is clear and moist.  Eyes: Conjunctivae and EOM are normal. PERRLA, no scleral icterus.  Neck: Normal ROM. Neck supple. No JVD. No tracheal deviation. No thyromegaly.  CVS: RRR, S1/S2 +, no murmurs, no gallops, no carotid bruit.  Pulmonary: Effort and breath sounds normal, no stridor, rhonchi, wheezes, rales.  Abdominal:  Soft. BS +,  no distension, tenderness, rebound or guarding.  Musculoskeletal: Normal range of motion. No edema and no tenderness.  Lymphadenopathy: No lymphadenopathy noted, cervical, inguinal. Neuro: Alert. Normal reflexes, muscle tone coordination. No cranial nerve deficit. Skin: Skin is warm and dry. No rash noted. Not diaphoretic. No erythema. No pallor.  Psychiatric: Normal mood and affect. Behavior, judgment, thought content normal.   Lab Results  Component Value Date   WBC 6.7 07/25/2013   HGB 9.7* 07/25/2013   HCT 27.8* 07/25/2013   MCV 93.3 07/25/2013   PLT 140* 07/25/2013   Lab Results  Component Value Date   CREATININE 0.46* 07/29/2013   BUN 10 07/29/2013   NA 138 07/29/2013   K 3.4* 07/29/2013   CL 103 07/29/2013   CO2 22 07/29/2013    No results found for this basename: HGBA1C   Lipid Panel  No results found for this basename: chol, trig, hdl, cholhdl, vldl, ldlcalc       Assessment and plan:   Patient Active Problem List   Diagnosis Date Noted  . Cryptogenic organizing pneumonia - Will followup with pulmonary on 08/14/2013. Will continue prednisone until seen by pulmonary. Has completed Levaquin as prescribed, total of 8 days including what she received during the hospital stay.  07/26/2013

## 2013-08-12 NOTE — Patient Instructions (Signed)

## 2013-08-12 NOTE — Progress Notes (Signed)
Pt here as HFU- Cryptogenic PNA s/p steroid treatment. Pt is taking Prednisone 20 mg bid C/o minimal chest pain pressure but denies SOB VSS. Pt has scheduled appt for Pulmonology next week

## 2013-08-14 ENCOUNTER — Inpatient Hospital Stay: Payer: BC Managed Care – PPO | Admitting: Pulmonary Disease

## 2013-08-15 ENCOUNTER — Ambulatory Visit (INDEPENDENT_AMBULATORY_CARE_PROVIDER_SITE_OTHER)
Admission: RE | Admit: 2013-08-15 | Discharge: 2013-08-15 | Disposition: A | Discharge status: Home / Self Care | Payer: Self-pay | Source: Ambulatory Visit | Attending: Adult Health | Admitting: Adult Health

## 2013-08-15 ENCOUNTER — Ambulatory Visit (INDEPENDENT_AMBULATORY_CARE_PROVIDER_SITE_OTHER): Payer: Self-pay | Admitting: Adult Health

## 2013-08-15 ENCOUNTER — Encounter: Payer: Self-pay | Admitting: Adult Health

## 2013-08-15 DIAGNOSIS — J84116 Cryptogenic organizing pneumonia: Secondary | ICD-10-CM

## 2013-08-15 NOTE — Progress Notes (Signed)
   Subjective:    Patient ID: Madison Copeland, female    DOB: 1985-05-14, 28 y.o.   MRN: [id number]  HPI  08/15/2013 [facility]  28 year old female with history of asthma, active smoker  uterine fibroids presents for a post hospital follow up .  Admitted 4/20-23 for  cryptogenic organizing pneumonia and now comes for followup. Patient had extensive workup while hospitalized including CT chest and chest x-ray which showed extensive bilateral pulmonary infiltrates. Her influenza panel, respiratory virus panel and HIV were negative. Autoimmune workup neg . She was discharged with prednisone 40mg  daily . Has finished Levaquin course.  CXR today shows interval resolution of basilar infiltrate. Lungs clear.  She has cut back on the prednisone to 20mg  daily . Does not like the steroids complains of wt gain, fluid retention, mood swings. Cough is some better.  Denies any hemoptysis, orthopnea, PND, leg swelling, or fever.     Review of Systems Constitutional:   No  weight loss, night sweats,  Fevers, chills, fatigue, or  lassitude.  HEENT:   No headaches,  Difficulty swallowing,  Tooth/dental problems, or  Sore throat,                No sneezing, itching, ear ache, nasal congestion, post nasal drip,   CV:  No chest pain,  Orthopnea, PND, swelling in lower extremities, anasarca, dizziness, palpitations, syncope.   GI  No heartburn, indigestion, abdominal pain, nausea, vomiting, diarrhea, change in bowel habits, loss of appetite, bloody stools.   Resp:  No chest wall deformity  Skin: no rash or lesions.  GU: no dysuria, change in color of urine, no urgency or frequency.  No flank pain, no hematuria   MS:  No joint pain or swelling.  No decreased range of motion.  No back pain.  Psych:  No change in mood or affect. No depression or anxiety.  No memory loss.         Objective:   Physical Exam GEN: A/Ox3; pleasant , NAD, well nourished   HEENT:  [place]/AT,  EACs-clear, TMs-wnl, NOSE-clear,  THROAT-clear, no lesions, no postnasal drip or exudate noted.   NECK:  Supple w/ fair ROM; no JVD; normal carotid impulses w/o bruits; no thyromegaly or nodules palpated; no lymphadenopathy.  RESP  Clear  P & A; w/o, wheezes/ rales/ or rhonchi.no accessory muscle use, no dullness to percussion  CARD:  RRR, no m/r/g  , no peripheral edema, pulses intact, no cyanosis or clubbing.  GI:   Soft & nt; nml bowel sounds; no organomegaly or masses detected.  Musco: Warm bil, no deformities or joint swelling noted.   Neuro: alert, no focal deficits noted.    Skin: Warm, no lesions or rashes         Assessment & Plan:

## 2013-08-15 NOTE — Patient Instructions (Signed)
Remain on Prednisone 20mg  daily in am w/ food for 3 weeks then decrease 10mg  daily  Hold at this dose  Avoid salty foods.  Follow up Dr. Halford Chessman  In 4 weeks and As needed   Please contact office for sooner follow up if symptoms do not improve or worsen or seek emergency care

## 2013-08-19 NOTE — Assessment & Plan Note (Addendum)
CXR is much improved   Plan  Remain on Prednisone 20mg  daily in am w/ food for 3 weeks then decrease 10mg  daily  Hold at this dose  Avoid salty foods.  Follow up Dr. Halford Chessman  In 4 weeks and As needed   Please contact office for sooner follow up if symptoms do not improve or worsen or seek emergency care

## 2013-08-29 ENCOUNTER — Other Ambulatory Visit: Payer: Self-pay | Admitting: Internal Medicine

## 2013-08-29 MED ORDER — AMLODIPINE BESYLATE 5 MG PO TABS: 5.0000 mg | ORAL_TABLET | Freq: Every day | ORAL | Status: DC

## 2013-09-18 ENCOUNTER — Encounter: Payer: Self-pay | Admitting: Pulmonary Disease

## 2013-09-18 ENCOUNTER — Ambulatory Visit (INDEPENDENT_AMBULATORY_CARE_PROVIDER_SITE_OTHER): Payer: Self-pay | Admitting: Pulmonary Disease

## 2013-09-18 VITALS — BP 124/72 | HR 90 | Ht 62.0 in | Wt 134.6 lb

## 2013-09-18 DIAGNOSIS — R918 Other nonspecific abnormal finding of lung field: Secondary | ICD-10-CM

## 2013-09-18 MED ORDER — PREDNISONE 5 MG PO TABS: ORAL_TABLET | ORAL | Status: DC

## 2013-09-18 NOTE — Progress Notes (Signed)
Chief Complaint  Patient presents with  . Follow-up    Pt states that breathing has been doing well. Pt states that she is not sure that the Prednisone is making much difference--cold symptoms are resolved but pt states that she feels no different now on the Prednisone than she did before she became sick, x 4 weeks ago    History of Present Illness: Madison Copeland is a 28 y.o. female with steroid response pulmonary infiltrates.  She has been doing well.  Except for b/l ankle soreness, she denies any other symptoms.  Specifically she does not have cough, wheeze, sputum, rash, fever, swelling, or sweats.  She remains on prednisone 10 mg daily.  TESTS: Echo 07/23/13 >> EF 65 to 70%, echodensity in Lt atrial wall 12 x 11 mm  Labs 07/23/13 >> ANA negative, ESR 5, RF < 10, HIV negative  Cardiac MRI 07/24/13 >> b/l ASD, no atrial mass  CT chest 07/25/13 >>> predominantly hilar b/l ASD, small b/l effusions   Caran Storck  has a past medical history of Asthma; Migraine; Fibroid, uterine; Tobacco abuse; Anemia; and Ovarian cyst rupture (07/22/13).  Haliey Romberg  has past surgical history that includes Cesarean section and Tubal ligation.  Prior to Admission medications   Medication Sig Start Date End Date Taking? Authorizing [place]ia Durell  ondansetron (ZOFRAN ODT) 4 MG disintegrating tablet Take 1 tablet (4 mg total) by mouth every 8 (eight) hours as needed for nausea or vomiting. 07/29/13  Yes Shanker Kristeen Mans, MD  predniSONE (DELTASONE) 20 MG tablet Take 10 mg by mouth daily. 07/30/13  Yes Shanker Kristeen Mans, MD  amLODipine (NORVASC) 5 MG tablet Take 1 tablet (5 mg total) by mouth daily. 08/29/13   Lorayne Marek, MD    Allergies  Allergen Reactions  . Zofran [Ondansetron Hcl]     Redness of face     Physical Exam:  General - No distress ENT - No sinus tenderness, no oral exudate, no LAN Cardiac - s1s2 regular, no murmur Chest - No wheeze/rales/dullness Back - No focal tenderness Abd - Soft,  non-tender Ext - No edema Neuro - Normal strength Skin - No rashes Psych - normal mood, and behavior  Dg Chest 2 View  08/15/2013   CLINICAL DATA:  Follow-up of pneumonia with history of asthma  EXAM: CHEST  2 VIEW  COMPARISON:  DG CHEST 2 VIEW dated 07/29/2013; DG CHEST 1V PORT dated 07/27/2013  FINDINGS: Since the previous study in the previously demonstrated left lower lobe infiltrate has resolved. The lungs are adequately inflated and clear today. There is no pleural effusion. The cardiopericardial silhouette is normal in size. The pulmonary vascularity is not engorged. The mediastinum is normal in width. The observed portions of the bony thorax appear normal.  IMPRESSION: There has been interval resolution of the basilar infiltrate on the left. The lungs now appear normal.   Electronically Signed   By: David  Martinique   On: 08/15/2013 10:04     Assessment/Plan:  Chesley Mires, MD [place] [facility] Pager:  [phone]

## 2013-09-18 NOTE — Assessment & Plan Note (Signed)
Improved clinically and on recent chest xray.  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  Will have her wean off prednisone.  Will then f/u in 2 months and re-assess if additional evaluation is needed.

## 2013-09-18 NOTE — Patient Instructions (Signed)
Prednisone 5 mg pill >> 1 pill daily for one week, then 1 pill every other day for one week, then stop prednisone Follow up in 2 months

## 2013-11-11 ENCOUNTER — Ambulatory Visit: Payer: BC Managed Care – PPO | Admitting: Internal Medicine

## 2013-11-18 ENCOUNTER — Ambulatory Visit: Payer: Self-pay | Admitting: Pulmonary Disease

## 2014-02-03 ENCOUNTER — Encounter: Payer: Self-pay | Admitting: Pulmonary Disease

## 2014-05-12 ENCOUNTER — Emergency Department (HOSPITAL_COMMUNITY)
Admission: EM | Admit: 2014-05-12 | Discharge: 2014-05-12 | Disposition: A | Discharge status: Home / Self Care | Payer: BLUE CROSS/BLUE SHIELD | Attending: Emergency Medicine | Admitting: Emergency Medicine

## 2014-05-12 ENCOUNTER — Encounter (HOSPITAL_COMMUNITY): Payer: Self-pay | Admitting: *Deleted

## 2014-05-12 ENCOUNTER — Emergency Department (INDEPENDENT_AMBULATORY_CARE_PROVIDER_SITE_OTHER)
Admission: EM | Admit: 2014-05-12 | Discharge: 2014-05-12 | Disposition: A | Discharge status: Home / Self Care | Payer: BLUE CROSS/BLUE SHIELD | Source: Home / Self Care | Attending: Family Medicine | Admitting: Family Medicine

## 2014-05-12 ENCOUNTER — Encounter (HOSPITAL_COMMUNITY): Payer: Self-pay | Admitting: Family Medicine

## 2014-05-12 ENCOUNTER — Emergency Department (HOSPITAL_COMMUNITY)
Admission: EM | Admit: 2014-05-12 | Discharge: 2014-05-12 | Discharge status: Against Medical Advice | Payer: BLUE CROSS/BLUE SHIELD | Attending: Emergency Medicine | Admitting: Emergency Medicine

## 2014-05-12 DIAGNOSIS — Z862 Personal history of diseases of the blood and blood-forming organs and certain disorders involving the immune mechanism: Secondary | ICD-10-CM | POA: Diagnosis not present

## 2014-05-12 DIAGNOSIS — Z8679 Personal history of other diseases of the circulatory system: Secondary | ICD-10-CM | POA: Insufficient documentation

## 2014-05-12 DIAGNOSIS — Z791 Long term (current) use of non-steroidal anti-inflammatories (NSAID): Secondary | ICD-10-CM | POA: Diagnosis not present

## 2014-05-12 DIAGNOSIS — Z87448 Personal history of other diseases of urinary system: Secondary | ICD-10-CM | POA: Insufficient documentation

## 2014-05-12 DIAGNOSIS — J45909 Unspecified asthma, uncomplicated: Secondary | ICD-10-CM | POA: Insufficient documentation

## 2014-05-12 DIAGNOSIS — K029 Dental caries, unspecified: Secondary | ICD-10-CM | POA: Insufficient documentation

## 2014-05-12 DIAGNOSIS — K088 Other specified disorders of teeth and supporting structures: Secondary | ICD-10-CM | POA: Insufficient documentation

## 2014-05-12 DIAGNOSIS — Z7952 Long term (current) use of systemic steroids: Secondary | ICD-10-CM | POA: Diagnosis not present

## 2014-05-12 DIAGNOSIS — Z87891 Personal history of nicotine dependence: Secondary | ICD-10-CM | POA: Insufficient documentation

## 2014-05-12 DIAGNOSIS — Z792 Long term (current) use of antibiotics: Secondary | ICD-10-CM | POA: Insufficient documentation

## 2014-05-12 DIAGNOSIS — K0889 Other specified disorders of teeth and supporting structures: Secondary | ICD-10-CM

## 2014-05-12 MED ORDER — BUPIVACAINE-EPINEPHRINE (PF) 0.5% -1:200000 IJ SOLN
INTRAMUSCULAR | Status: AC
  Filled 2014-05-12: qty 1.8

## 2014-05-12 MED ORDER — IBUPROFEN 800 MG PO TABS
ORAL_TABLET | ORAL | Status: AC
  Filled 2014-05-12: qty 1

## 2014-05-12 MED ORDER — AMOXICILLIN 500 MG PO CAPS: 500.0000 mg | ORAL_CAPSULE | Freq: Three times a day (TID) | ORAL | Status: DC

## 2014-05-12 MED ORDER — NAPROXEN 500 MG PO TABS: 500.0000 mg | ORAL_TABLET | Freq: Two times a day (BID) | ORAL | Status: DC

## 2014-05-12 MED ORDER — HYDROCODONE-ACETAMINOPHEN 5-325 MG PO TABS
1.0000 | ORAL_TABLET | Freq: Once | ORAL | Status: AC
  Administered 2014-05-12: 1 via ORAL

## 2014-05-12 MED ORDER — LIDOCAINE VISCOUS 2 % MT SOLN: 20.0000 mL | OROMUCOSAL | Status: DC | PRN

## 2014-05-12 MED ORDER — LIDOCAINE VISCOUS 2 % MT SOLN
15.0000 mL | Freq: Once | OROMUCOSAL | Status: DC
  Filled 2014-05-12: qty 15

## 2014-05-12 MED ORDER — HYDROCODONE-ACETAMINOPHEN 5-325 MG PO TABS
ORAL_TABLET | ORAL | Status: AC
  Filled 2014-05-12: qty 1

## 2014-05-12 MED ORDER — IBUPROFEN 800 MG PO TABS
800.0000 mg | ORAL_TABLET | Freq: Once | ORAL | Status: AC
  Administered 2014-05-12: 800 mg via ORAL

## 2014-05-12 NOTE — ED Provider Notes (Signed)
CSN: [id number]     Arrival date & time 05/12/14  0302 History   First MD Initiated Contact with Patient 05/12/14 [phone]     Chief Complaint  Patient presents with  . Dental Pain     (Consider location/radiation/quality/duration/timing/severity/associated sxs/prior Treatment) HPI Comments: Patient is a 29 yo F presenting to the ED for dental pain x 2 days.   Patient is a 29 y.o. female presenting with tooth pain. The history is provided by the patient.  Dental Pain Location:  Lower Lower teeth location:  32/RL 3rd molar Quality:  Throbbing and shooting Severity:  Severe Onset quality:  Sudden Duration:  2 days Timing:  Constant Progression:  Worsening Chronicity:  New Context: dental fracture and poor dentition   Context: not abscess   Relieved by:  Nothing Worsened by:  Cold food/drink Ineffective treatments:  NSAIDs and topical anesthetic gel Associated symptoms: no congestion, no difficulty swallowing, no drooling, no facial pain, no facial swelling, no fever, no gum swelling, no headaches, no neck pain, no neck swelling, no oral bleeding, no oral lesions and no trismus   Risk factors: no alcohol problem, no cancer, no chewing tobacco use, no diabetes, no immunosuppression and sufficient dental care     Past Medical History  Diagnosis Date  . Asthma   . Migraine   . Fibroid, uterine     small intramural  . Tobacco abuse   . Anemia     during pregnancy  . Ovarian cyst rupture 07/22/13   Past Surgical History  Procedure Laterality Date  . Cesarean section      x 2  . Tubal ligation     Family History  Problem Relation Age of Onset  . Diabetes Mother   . Heart disease Mother   . Cervical cancer Maternal Aunt   . Kidney disease Maternal Grandmother    History  Substance Use Topics  . Smoking status: Former Smoker -- 0.25 packs/day for 15 years    Types: Cigarettes    Quit date: 07/08/2013  . Smokeless tobacco: Never Used  . Alcohol Use: Yes     Comment:  occasion   OB History    Gravida Para Term Preterm AB TAB SAB Ectopic Multiple Living   3    1  1   2      Review of Systems  Constitutional: Negative for fever.  HENT: Positive for dental problem. Negative for congestion, drooling, facial swelling and mouth sores.   Musculoskeletal: Negative for neck pain.  Neurological: Negative for headaches.  All other systems reviewed and are negative.     Allergies  Zofran  Home Medications   Prior to Admission medications   Medication Sig Start Date End Date Taking? Authorizing Provider  ibuprofen (ADVIL,MOTRIN) 200 MG tablet Take 800 mg by mouth every 6 (six) hours as needed for moderate pain.   Yes Historical Provider, MD  amLODipine (NORVASC) 5 MG tablet Take 1 tablet (5 mg total) by mouth daily. Patient not taking: Reported on 05/12/2014 08/29/13   Lorayne Marek, MD  amoxicillin (AMOXIL) 500 MG capsule Take 1 capsule (500 mg total) by mouth 3 (three) times daily. 05/12/14   Anshul Meddings L Isatou Agredano, PA-C  lidocaine (XYLOCAINE) 2 % solution Use as directed 20 mLs in the mouth or throat as needed for mouth pain. 05/12/14   Misbah Hornaday L Gissella Niblack, PA-C  naproxen (NAPROSYN) 500 MG tablet Take 1 tablet (500 mg total) by mouth 2 (two) times daily with a meal. 05/12/14   Anderson Malta  L Henok Heacock, PA-C  ondansetron (ZOFRAN ODT) 4 MG disintegrating tablet Take 1 tablet (4 mg total) by mouth every 8 (eight) hours as needed for nausea or vomiting. Patient not taking: Reported on 05/12/2014 07/29/13   Jonetta Osgood, MD  predniSONE (DELTASONE) 5 MG tablet 1 pill daily for 1 week, then 1 pill every other day for one week Patient not taking: Reported on 05/12/2014 09/18/13   Chesley Mires, MD   BP 148/91 mmHg  Pulse 76  Temp(Src) 98.4 F (36.9 C) (Oral)  Resp 18  Ht 5\' 2"  (1.575 m)  Wt 135 lb (61.236 kg)  BMI 24.69 kg/m2  SpO2 100%  LMP 05/05/2014 (Approximate) Physical Exam  Constitutional: She is oriented to person, place, and time. She appears  well-developed and well-nourished. No distress.  HENT:  Head: Normocephalic and atraumatic.  Right Ear: External ear normal.  Left Ear: External ear normal.  Nose: Nose normal.  Mouth/Throat: Uvula is midline, oropharynx is clear and moist and mucous membranes are normal. No oral lesions. No trismus in the jaw. Abnormal dentition. Dental caries present. No dental abscesses or uvula swelling.    Submental and sublingual spaces are soft.  Eyes: Conjunctivae are normal.  Neck: Normal range of motion. Neck supple.  Cardiovascular: Normal rate.   Pulmonary/Chest: Effort normal.  Neurological: She is alert and oriented to person, place, and time.  Skin: Skin is warm and dry. She is not diaphoretic.  Psychiatric: She has a normal mood and affect.  Nursing note and vitals reviewed.   ED Course  Procedures (including critical care time) Medications  lidocaine (XYLOCAINE) 2 % viscous mouth solution 15 mL (15 mLs Mouth/Throat Not Given 05/12/14 0423)    Labs Review Labs Reviewed - No data to display  Imaging Review No results found.   EKG Interpretation None      MDM   Final diagnoses:  Pain, dental    Filed Vitals:   05/12/14 0424  BP: 148/91  Pulse: 76  Temp:   Resp: 18   Afebrile, NAD, non-toxic appearing, AAOx4.  Patient with toothache.  No gross abscess.  Exam unconcerning for Ludwig's angina or spread of infection.  Will treat with amoxil and pain medicine.  Urged patient to follow-up with dentist.  Return precautions discussed. Patient is agreeable to plan. Patient is stable at time of discharge.      Harlow Mares, PA-C 05/12/14 [id number]  Blanchie Dessert, MD 05/12/14 [phone]

## 2014-05-12 NOTE — ED Notes (Signed)
Toothache  r  Lower  Molar    X  4  Days     rx      amox   And  Lidocaine        Has  Dental  appt  tommorow          C/o  Pain  Today

## 2014-05-12 NOTE — ED Notes (Addendum)
Awake. Verbally responsive. A/O x4. Resp even and unlabored. No audible adventitious breath sounds noted. ABC's intact. Dental pain to rt lower molar with cavities noted. Mouth pink and moist. No white patches to tonsils/throat area. No swelling noted.

## 2014-05-12 NOTE — ED Provider Notes (Signed)
CSN: [id number]     Arrival date & time 05/12/14  1426 History   First MD Initiated Contact with Patient 05/12/14 1546     Chief Complaint  Patient presents with  . Dental Pain   (Consider location/radiation/quality/duration/timing/severity/associated sxs/prior Treatment) Patient is a 29 y.o. female presenting with tooth pain. The history is provided by the patient.  Dental Pain Location:  Lower Lower teeth location:  30/RL 1st molar and 29/RL 2nd bicuspid Quality:  Throbbing Severity:  Moderate Onset quality:  Gradual Progression:  Worsening Chronicity:  New Context: crown fracture, dental caries, dental fracture and poor dentition   Context comment:  Seen earlier today at [facility] but pt desires dental block. Associated symptoms: no facial swelling and no fever     Past Medical History  Diagnosis Date  . Asthma   . Migraine   . Fibroid, uterine     small intramural  . Tobacco abuse   . Anemia     during pregnancy  . Ovarian cyst rupture 07/22/13   Past Surgical History  Procedure Laterality Date  . Cesarean section      x 2  . Tubal ligation     Family History  Problem Relation Age of Onset  . Diabetes Mother   . Heart disease Mother   . Cervical cancer Maternal Aunt   . Kidney disease Maternal Grandmother    History  Substance Use Topics  . Smoking status: Former Smoker -- 0.25 packs/day for 15 years    Types: Cigarettes    Quit date: 07/08/2013  . Smokeless tobacco: Never Used  . Alcohol Use: Yes     Comment: occasion   OB History    Gravida Para Term Preterm AB TAB SAB Ectopic Multiple Living   3    1  1   2      Review of Systems  Constitutional: Negative.  Negative for fever.  HENT: Positive for dental problem. Negative for facial swelling.     Allergies  Zofran  Home Medications   Prior to Admission medications   Medication Sig Start Date End Date Taking? Authorizing Provider  amLODipine (NORVASC) 5 MG tablet Take 1 tablet (5 mg total) by mouth  daily. Patient not taking: Reported on 05/12/2014 08/29/13   Lorayne Marek, MD  amoxicillin (AMOXIL) 500 MG capsule Take 1 capsule (500 mg total) by mouth 3 (three) times daily. 05/12/14   Jennifer L Piepenbrink, PA-C  ibuprofen (ADVIL,MOTRIN) 200 MG tablet Take 800 mg by mouth every 6 (six) hours as needed for moderate pain.    Historical Provider, MD  lidocaine (XYLOCAINE) 2 % solution Use as directed 20 mLs in the mouth or throat as needed for mouth pain. 05/12/14   Jennifer L Piepenbrink, PA-C  naproxen (NAPROSYN) 500 MG tablet Take 1 tablet (500 mg total) by mouth 2 (two) times daily with a meal. 05/12/14   Jennifer L Piepenbrink, PA-C  ondansetron (ZOFRAN ODT) 4 MG disintegrating tablet Take 1 tablet (4 mg total) by mouth every 8 (eight) hours as needed for nausea or vomiting. Patient not taking: Reported on 05/12/2014 07/29/13   Jonetta Osgood, MD  predniSONE (DELTASONE) 5 MG tablet 1 pill daily for 1 week, then 1 pill every other day for one week Patient not taking: Reported on 05/12/2014 09/18/13   Chesley Mires, MD   BP 124/91 mmHg  Pulse 71  Temp(Src) 99 F (37.2 C) (Oral)  Resp 16  SpO2 100%  LMP 05/05/2014 (Approximate) Physical Exam  Constitutional: She  appears well-developed and well-nourished. She appears distressed.  HENT:  Mouth/Throat:    Nursing note and vitals reviewed.   ED Course  Dental Date/Time: 05/12/2014 4:28 PM Performed by: Billy Fischer Authorized by: Ihor Gully D Consent: Verbal consent obtained. Consent given by: patient Preparation: Patient was prepped and draped in the usual sterile fashion. Local anesthesia used: yes (dental gel applied.) Anesthesia: nerve block Local anesthetic: bupivacaine 0.5% with epinephrine Anesthetic total: 4 ml Patient sedated: no Patient tolerance: Patient tolerated the procedure well with no immediate complications Comments: Sx relieved.   (including critical care time) Labs Review Labs Reviewed - No data to  display  Imaging Review No results found.   MDM   1. Pain due to dental caries        Billy Fischer, MD 05/12/14 [phone]

## 2014-05-12 NOTE — Discharge Instructions (Signed)
See your dentist as planned on tues.

## 2014-05-12 NOTE — ED Notes (Signed)
Pt reports rt lower tooth pain x2 days intermittent, acutely worse tonight.

## 2014-05-12 NOTE — Discharge Instructions (Signed)
Please follow up with your primary care physician in 1-2 days. If you do not have one please call the [facility] number listed above. Please follow up with Dr. Geralynn Ochs to schedule a follow up appointment.  Please take your antibiotic until completion. Please alternate between Motrin and Tylenol every three hours for pain. Please read all discharge instructions and return precautions.    Dental Pain A tooth ache may be caused by cavities (tooth decay). Cavities expose the nerve of the tooth to air and hot or cold temperatures. It may come from an infection or abscess (also called a boil or furuncle) around your tooth. It is also often caused by dental caries (tooth decay). This causes the pain you are having. DIAGNOSIS  Your caregiver can diagnose this problem by exam. TREATMENT   If caused by an infection, it may be treated with medications which kill germs (antibiotics) and pain medications as prescribed by your caregiver. Take medications as directed.  Only take over-the-counter or prescription medicines for pain, discomfort, or fever as directed by your caregiver.  Whether the tooth ache today is caused by infection or dental disease, you should see your dentist as soon as possible for further care. SEEK MEDICAL CARE IF: The exam and treatment you received today has been provided on an emergency basis only. This is not a substitute for complete medical or dental care. If your problem worsens or new problems (symptoms) appear, and you are unable to meet with your dentist, call or return to this location. SEEK IMMEDIATE MEDICAL CARE IF:   You have a fever.  You develop redness and swelling of your face, jaw, or neck.  You are unable to open your mouth.  You have severe pain uncontrolled by pain medicine. MAKE SURE YOU:   Understand these instructions.  Will watch your condition.  Will get help right away if you are not doing well or get worse. Document Released:  03/21/2005 Document Revised: 06/13/2011 Document Reviewed: 11/07/2007 [facility] Patient Information 2015 [place], [place]. This information is not intended to replace advice given to you by your health care provider. Make sure you discuss any questions you have with your health care provider.

## 2014-05-12 NOTE — ED Notes (Signed)
Pt here for right lower dental pain x 3 days. Pt crying.

## 2014-05-12 NOTE — ED Notes (Signed)
Awake. Verbally responsive. A/O x4. Resp even and unlabored. No audible adventitious breath sounds noted. ABC's intact.  

## 2014-05-12 NOTE — ED Notes (Signed)
Dentall  Block  Done  By  Dr Juventino Slovak

## 2014-11-19 ENCOUNTER — Encounter: Payer: Self-pay | Admitting: Gastroenterology

## 2014-11-19 ENCOUNTER — Ambulatory Visit (INDEPENDENT_AMBULATORY_CARE_PROVIDER_SITE_OTHER): Payer: BLUE CROSS/BLUE SHIELD | Admitting: Gastroenterology

## 2014-11-19 VITALS — BP 132/78 | HR 65 | Ht 62.0 in | Wt 140.0 lb

## 2014-11-19 DIAGNOSIS — R110 Nausea: Secondary | ICD-10-CM

## 2014-11-19 DIAGNOSIS — R1013 Epigastric pain: Secondary | ICD-10-CM

## 2014-11-19 MED ORDER — OMEPRAZOLE 40 MG PO CPDR
DELAYED_RELEASE_CAPSULE | ORAL | Status: DC
Start: 2014-11-19 — End: 2015-09-01

## 2014-11-19 MED ORDER — OMEPRAZOLE 40 MG PO CPDR: 40.0000 mg | DELAYED_RELEASE_CAPSULE | Freq: Every day | ORAL | Status: DC

## 2014-11-19 MED ORDER — HYOSCYAMINE SULFATE 0.125 MG SL SUBL: SUBLINGUAL_TABLET | SUBLINGUAL | Status: DC

## 2014-11-19 NOTE — Progress Notes (Signed)
    History of Present Illness: This is a 29 year old female complaining of epigastric pain and nausea for several months. Symptoms are exacerbated by food. She was previously evaluated in 2014 for constipation and LLQ pain. Please see those notes. Recently she states she's having a good bowel movement every other day. She takes ibuprofen several times per week. She's been taking Alka-Seltzer frequently. Denies weight loss, constipation, diarrhea, change in stool caliber, melena, hematochezia, vomiting, dysphagia, reflux symptoms, chest pain.   Current Medications, Allergies, Past Medical History, Past Surgical History, Family History and Social History were reviewed in [employer] record.  Physical Exam: General: Well developed , well nourished, no acute distress Head: Normocephalic and atraumatic Eyes:  sclerae anicteric, EOMI Ears: Normal auditory acuity Mouth: No deformity or lesions Lungs: Clear throughout to auscultation Heart: Regular rate and rhythm; no murmurs, rubs or bruits Abdomen: Soft, epigastric tenderness and non distended. No masses, hepatosplenomegaly or hernias noted. Normal Bowel sounds Musculoskeletal: Symmetrical with no gross deformities  Pulses:  Normal pulses noted Extremities: No clubbing, cyanosis, edema or deformities noted Neurological: Alert oriented x 4, grossly nonfocal Psychological:  Alert and cooperative. Normal mood and affect  Assessment and Recommendations:  1. Epigastric pain, nausea. Symptoms worsened with meals. R/O ulcer, gastritis, GERD, cholelithiasis. DC Alka Seltzer. DC or minimize NSAIDs. Begin omeprazole 40 mg twice daily for 1 week and then daily. Begin hyoscyamine 1-2 every 4 hours as needed Schedule abd [place] and EGD. The risks (including bleeding, perforation, infection, missed lesions, medication reactions and possible hospitalization or surgery if complications occur), benefits, and alternatives to endoscopy with  possible biopsy and possible dilation were discussed with the patient and they consent to proceed.

## 2014-11-19 NOTE — Patient Instructions (Addendum)
We have sent the following medications to your pharmacy for you to pick up at your convenience:omeprazole and levsin.  You have been scheduled for an abdominal ultrasound at [facility] (1st floor of hospital) on 11/28/14 at 8:30am. Please arrive 15 minutes prior to your appointment for registration. Make certain not to have anything to eat or drink 6 hours prior to your appointment. Should you need to reschedule your appointment, please contact radiology at [phone]. This test typically takes about 30 minutes to perform.  You have been scheduled for an endoscopy. Please follow written instructions given to you at your visit today. If you use inhalers (even only as needed), please bring them with you on the day of your procedure.   Minimize or stop all NSAID's.   Stop taking Alka-seltzer.   Thank you for choosing me and [facility].  Pricilla Riffle. Dagoberto Ligas., MD., Marval Regal

## 2014-11-28 ENCOUNTER — Ambulatory Visit (HOSPITAL_COMMUNITY)
Admission: RE | Admit: 2014-11-28 | Discharge: 2014-11-28 | Disposition: A | Discharge status: Home / Self Care | Payer: BLUE CROSS/BLUE SHIELD | Source: Ambulatory Visit | Attending: Gastroenterology | Admitting: Gastroenterology

## 2014-11-28 DIAGNOSIS — R1013 Epigastric pain: Secondary | ICD-10-CM | POA: Insufficient documentation

## 2014-11-28 DIAGNOSIS — R110 Nausea: Secondary | ICD-10-CM | POA: Insufficient documentation

## 2014-11-28 DIAGNOSIS — R109 Unspecified abdominal pain: Secondary | ICD-10-CM | POA: Diagnosis present

## 2015-01-29 ENCOUNTER — Encounter (HOSPITAL_COMMUNITY): Payer: Self-pay | Admitting: *Deleted

## 2015-01-29 ENCOUNTER — Emergency Department (HOSPITAL_COMMUNITY)
Admission: EM | Admit: 2015-01-29 | Discharge: 2015-01-29 | Disposition: A | Discharge status: Home / Self Care | Payer: Self-pay | Attending: Emergency Medicine | Admitting: Emergency Medicine

## 2015-01-29 ENCOUNTER — Emergency Department (HOSPITAL_COMMUNITY): Payer: BLUE CROSS/BLUE SHIELD

## 2015-01-29 DIAGNOSIS — I471 Supraventricular tachycardia: Secondary | ICD-10-CM | POA: Insufficient documentation

## 2015-01-29 DIAGNOSIS — Z8679 Personal history of other diseases of the circulatory system: Secondary | ICD-10-CM | POA: Insufficient documentation

## 2015-01-29 DIAGNOSIS — Z79899 Other long term (current) drug therapy: Secondary | ICD-10-CM | POA: Insufficient documentation

## 2015-01-29 DIAGNOSIS — Z87891 Personal history of nicotine dependence: Secondary | ICD-10-CM | POA: Insufficient documentation

## 2015-01-29 DIAGNOSIS — Z862 Personal history of diseases of the blood and blood-forming organs and certain disorders involving the immune mechanism: Secondary | ICD-10-CM | POA: Insufficient documentation

## 2015-01-29 DIAGNOSIS — Z86018 Personal history of other benign neoplasm: Secondary | ICD-10-CM | POA: Insufficient documentation

## 2015-01-29 DIAGNOSIS — J45909 Unspecified asthma, uncomplicated: Secondary | ICD-10-CM | POA: Insufficient documentation

## 2015-01-29 DIAGNOSIS — Z8742 Personal history of other diseases of the female genital tract: Secondary | ICD-10-CM | POA: Insufficient documentation

## 2015-01-29 HISTORY — DX: Cryptogenic organizing pneumonia: J84.116

## 2015-01-29 LAB — BASIC METABOLIC PANEL
ANION GAP: 7 (ref 5–15)
BUN: 11 mg/dL (ref 6–20)
CO2: 27 mmol/L (ref 22–32)
Calcium: 10 mg/dL (ref 8.9–10.3)
Chloride: 107 mmol/L (ref 101–111)
Creatinine, Ser: 0.74 mg/dL (ref 0.44–1.00)
GFR calc Af Amer: >60 mL/min (ref >60)
GFR calc non Af Amer: >60 mL/min (ref >60)
GLUCOSE: 94 mg/dL (ref 65–99)
POTASSIUM: 3.9 mmol/L (ref 3.5–5.1)
Sodium: 141 mmol/L (ref 135–145)

## 2015-01-29 LAB — CBC
HEMATOCRIT: 36.9 % (ref 36.0–46.0)
HEMOGLOBIN: 12.4 g/dL (ref 12.0–15.0)
MCH: 31 pg (ref 26.0–34.0)
MCHC: 33.6 g/dL (ref 30.0–36.0)
MCV: 92.3 fL (ref 78.0–100.0)
Platelets: 311 10*3/uL (ref 150–400)
RBC: 4 MIL/uL (ref 3.87–5.11)
RDW: 12.5 % (ref 11.5–15.5)
WBC: 5.5 10*3/uL (ref 4.0–10.5)

## 2015-01-29 LAB — I-STAT TROPONIN, ED: TROPONIN I, POC: 0 ng/mL (ref 0.00–0.08)

## 2015-01-29 LAB — D-DIMER, QUANTITATIVE: D-Dimer, Quant: <0.27 ug/mL-FEU (ref 0.00–0.48)

## 2015-01-29 NOTE — ED Notes (Signed)
MD at bedside. 

## 2015-01-29 NOTE — Discharge Instructions (Signed)
Paroxysmal Supraventricular Tachycardia Paroxysmal supraventricular tachycardia (PSVT) is a type of abnormal heart rhythm. It causes your heart to beat very quickly and then suddenly stop beating so quickly. A normal heart rate is 60-100 beats per minute. During an episode of PSVT, your heart rate may be 150-250 beats per minute. This can make you feel light-headed and short of breath. An episode of PSVT can be frightening. It is usually not dangerous. The heart has four chambers. All chambers need to work together for the heart to beat effectively. A normal heartbeat usually starts in the right upper chamber of the heart (atrium) when an area (sinoatrial node) puts out an electrical signal that spreads to the other chambers. People with PSVT may have abnormal electrical pathways, or they may have other areas in the upper chambers that send out electrical signals. The result is a very rapid heartbeat. When your heart beats very quickly, it does not have time to fill completely with blood. When PSVT happens often or it lasts for long periods, it can lead to heart weakness and failure. Most people with PSVT do not have any other heart disease. CAUSES Abnormal electrical activity in the heart causes PSVT. It is not known why some people get PSVT and others do not. RISK FACTORS You may be more likely to have PSVT if:  You are 20-30 years old.  You are a woman. Other factors that may increase your chances of an attack include:  Stress.  Being tired.  Smoking.  Stimulant drugs.  Alcoholic drinks.  Caffeine.  Pregnancy. SIGNS AND SYMPTOMS A mild episode of PSVT may cause no symptoms. If you do have signs and symptoms, they may include:  A pounding heart.  Feeling of skipped heartbeats (palpitations).  Weakness.  Shortness of breath.  Tightness or pain in your chest.  Light-headedness.  Anxiety.  Dizziness.  Sweating.  Nausea.  A fainting spell. DIAGNOSIS Your health care  provider may suspect PSVT if you have symptoms that come and go. The health care provider will do a physical exam. If you are having an episode during the exam, the health care provider may be able to diagnose PSVT by listening to your heart and feeling your pulse. Tests may also be done, including:  An electrical study of your heart (electrocardiogram, or ECG).  A test in which you wear a portable ECG monitor all day (Holter monitor) or for several days (event monitor).  A test that involves taking an image of your heart using sound waves (echocardiogram) to rule out other causes of a fast heart rate. TREATMENT You may not need treatment if episodes of PSVT do not happen often or if they do not cause symptoms. If PSVT episodes do cause symptoms, your health care provider may first suggest trying a self-treatment called vagus nerve stimulation. The vagus nerve extends down from the brain. It regulates certain body functions. Stimulating this nerve can slow down the heart. Your health care provider can teach you ways to do this. You may need to try a few ways to find what works best for you. Options include:  Holding your breath and pushing, as though you are having a bowel movement.  Massaging an area on one side of your neck below your jaw.  Bending forward with your head between your legs.  Bending forward with your head between your legs and coughing.  Massaging your eyeballs with your eyes closed. If vagus nerve stimulation does not work, other treatment options include:    Medicines to prevent an attack.  Being treated in the hospital with medicine or electric shock to stop an attack (cardioversion). This treatment can include:  Getting medicine through an IV line.  Having a small electric shock delivered to your heart. You will be given medicine to make you sleep through this procedure.  If you have frequent episodes with symptoms, you may need a procedure to get rid of the faulty  areas of your heart (radiofrequency ablation) and end the episodes of PSVT. In this procedure:  A long, thin tube (catheter) is passed through one of your veins into your heart.  Energy directed through the catheter eliminates the areas of your heart that are causing abnormal electric stimulation. HOME CARE INSTRUCTIONS  Take medicines only as directed by your health care provider.  Do not use caffeine in any form if caffeine triggers episodes of PSVT. Otherwise, consume caffeine in moderation. This means no more than a few cups of coffee or the equivalent each day.  Do not drink alcohol if alcohol triggers episodes of PSVT. Otherwise, limit alcohol intake to no more than 1 drink per day for nonpregnant women and 2 drinks per day for men. One drink equals 12 ounces of beer, 5 ounces of wine, or 1 ounces of hard liquor.  Do not use any tobacco products, including cigarettes, chewing tobacco, or electronic cigarettes. If you need help quitting, ask your health care provider.  Try to get at least 7 hours of sleep each night.  Find healthy ways to manage stress.  Perform vagus nerve stimulation as directed by your health care provider.  Maintain a healthy weight.  Get some exercise on most days. Ask your health care provider to suggest some good activities for you. SEEK MEDICAL CARE IF:  You are having episodes of PSVT more often, or they are lasting longer.  Vagus nerve stimulation is no longer helping.  You have new symptoms during an episode. SEEK IMMEDIATE MEDICAL CARE IF:  You have chest pain or trouble breathing.  You have an episode of PSVT that has lasted longer than 20 minutes.  You have passed out from an episode of PSVT. These symptoms may represent a serious problem that is an emergency. Do not wait to see if the symptoms will go away. Get medical help right away. Call your local emergency services (911 in the [place] Do not drive yourself to the hospital.   This  information is not intended to replace advice given to you by your health care provider. Make sure you discuss any questions you have with your health care provider.   Document Released: 03/21/2005 Document Revised: 04/11/2014 Document Reviewed: 08/29/2013 Elsevier Interactive Patient Education 2016 [employer]  

## 2015-01-29 NOTE — ED Provider Notes (Signed)
CSN: [id number]     Arrival date & time 01/29/15  1415 History   First MD Initiated Contact with Patient 01/29/15 1428     Chief Complaint  Patient presents with  . Tachycardia     The history is provided by the patient. No language interpreter was used.   Ms. Madison Copeland presents for evaluation of chest pain and SOB.  Sxs started just prior to ED arrival.  She was at work and developed palpitations, chest discomfort, SOB.  Sxs were waxing and waning, transiently better with supplemental oxygen.  When EMS arrived she was noted to be in SVT, rate in the 160s.  En route to the hospital she spontaneously converted to sinus rhythm. She currently has mild CP/SOB but this is improved from previously.  She denies any fevers, abdominal pain, lower extremity edema, recent illness.  She has been hospitalized with boop in the past.  She has no current medical problems and she started minocycline a week ago for rash.   Past Medical History  Diagnosis Date  . Asthma   . Migraine   . Fibroid, uterine     small intramural  . Tobacco abuse   . Anemia     during pregnancy  . Ovarian cyst rupture 07/22/13  . Cryptogenic organizing pneumonia [facility])    Past Surgical History  Procedure Laterality Date  . Cesarean section      x 2  . Tubal ligation     Family History  Problem Relation Age of Onset  . Diabetes Mother   . Heart disease Mother   . Cervical cancer Maternal Aunt   . Kidney disease Maternal Grandmother    Social History  Substance Use Topics  . Smoking status: Former Smoker -- 0.25 packs/day for 15 years    Types: Cigarettes    Quit date: 07/08/2013  . Smokeless tobacco: Never Used  . Alcohol Use: Yes     Comment: occasion   OB History    Gravida Para Term Preterm AB TAB SAB Ectopic Multiple Living   3    1  1   2      Review of Systems  All other systems reviewed and are negative.     Allergies  Zofran  Home Medications   Prior to Admission medications   Medication Sig  Start Date End Date Taking? Authorizing Provider  minocycline (MINOCIN,DYNACIN) 100 MG capsule Take 100 mg by mouth 2 (two) times daily.   Yes Historical Provider, MD  hyoscyamine (LEVSIN/SL) 0.125 MG SL tablet Take 1-2 tablets by mouth every 4 hours as needed for abdominal pain Patient not taking: Reported on 01/29/2015 11/19/14   Ladene Artist, MD  omeprazole (PRILOSEC) 40 MG capsule One tablet by mouth twice daily x 1 week, then reduce to once daily Patient not taking: Reported on 01/29/2015 11/19/14   Ladene Artist, MD  omeprazole (PRILOSEC) 40 MG capsule Take 1 capsule (40 mg total) by mouth daily. Patient not taking: Reported on 01/29/2015 11/19/14   Ladene Artist, MD   BP 136/95 mmHg  Pulse 84  Temp(Src) 98.1 F (36.7 C) (Oral)  Resp 16  SpO2 99%  LMP 12/30/2014 Physical Exam  Constitutional: She is oriented to person, place, and time. She appears well-developed and well-nourished.  HENT:  Head: Normocephalic and atraumatic.  Cardiovascular: Normal rate and regular rhythm.   No murmur heard. Pulmonary/Chest: Effort normal and breath sounds normal. No respiratory distress. She exhibits tenderness.  Abdominal: Soft. There is no tenderness.  There is no rebound and no guarding.  Musculoskeletal: She exhibits no edema or tenderness.  Neurological: She is alert and oriented to person, place, and time.  Skin: Skin is warm and dry.  Psychiatric: She has a normal mood and affect. Her behavior is normal.  Nursing note and vitals reviewed.   ED Course  Procedures (including critical care time) Labs Review Labs Reviewed  BASIC METABOLIC PANEL  CBC  D-DIMER, QUANTITATIVE (NOT AT [facility])  I-STAT TROPOININ, ED    Imaging Review No results found. I have personally reviewed and evaluated these images and lab results as part of my medical decision-making.   EKG Interpretation   Date/Time:  Thursday January 29 2015 14:47:34 EDT Ventricular Rate:  83 PR Interval:  165 QRS  Duration: 71 QT Interval:  353 QTC Calculation: 415 R Axis:   91 Text Interpretation:  Sinus rhythm RAE, consider biatrial enlargement  Borderline right axis deviation Borderline T abnormalities, anterior leads  Confirmed by Hazle Coca [phone]) on 01/29/2015 3:27:15 PM      MDM   Final diagnoses:  Paroxysmal SVT (supraventricular tachycardia) (HCC)    Pt here for evaluation of palpitations, sob, chest pain, was in SVT prior to ED arrival, spontaneously converted.  Pt improved on recheck with no complaints.  Presentation not c/w PE, ACS, dissection.  Discussed cardiology follow up, return precautions.      Quintella Reichert, MD 01/30/15 1146

## 2015-01-29 NOTE — ED Notes (Signed)
Pt presents via [facility] from work after feeling palpitations/CP/SOB.  Initally pt was in SVT HR 160s.  Pt converted on her own to SR HR 110s en route without medication.  Pt reports starting new abx x 1 week ago (minocyclin) for a skin rash.  Pt denies hx of SVT but reports hx of irregular heart beat and rare lung disorder.  Pt a x 4, NAD.  BP-145/84 P-110 O2-98% RA, CBG-99.

## 2015-01-30 ENCOUNTER — Encounter: Payer: BLUE CROSS/BLUE SHIELD | Admitting: Gastroenterology

## 2015-01-30 ENCOUNTER — Telehealth: Payer: Self-pay | Admitting: Gastroenterology

## 2015-01-30 NOTE — Telephone Encounter (Signed)
Yes charge for [place] no show

## 2015-02-04 IMAGING — CT CT CHEST W/ CM
2 of 3 series · 15 of 36 positions shown, 18 images · IV contrast (Omni 300)
Comparison: Cardiac MRI July 24, 2013

CLINICAL DATA: Parenchymal lung disease noted on recent MRI

EXAM:
CT CHEST WITH CONTRAST
TECHNIQUE: Multidetector CT imaging of the chest was performed during
intravenous contrast administration.
CONTRAST:  75mL OMNIPAQUE IOHEXOL 300 MG/ML  SOLN

[Series 2: thorax 5.0 i31f 1 · axial · 0.66mm/px · z∈[-304,-49]mm · 12 of 61 slices shown, 15 images]
[im 5/61  mediastinal]
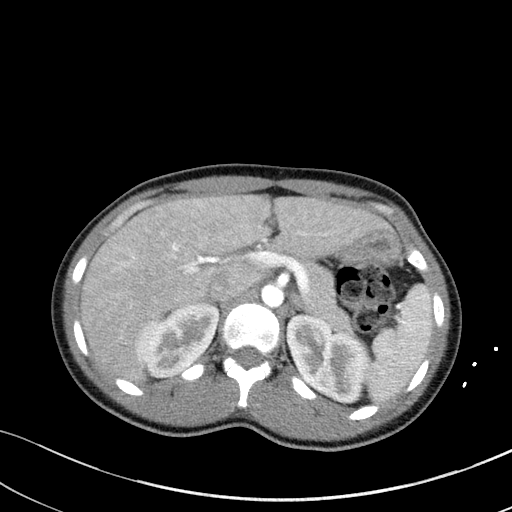
[im 5/61  lung]
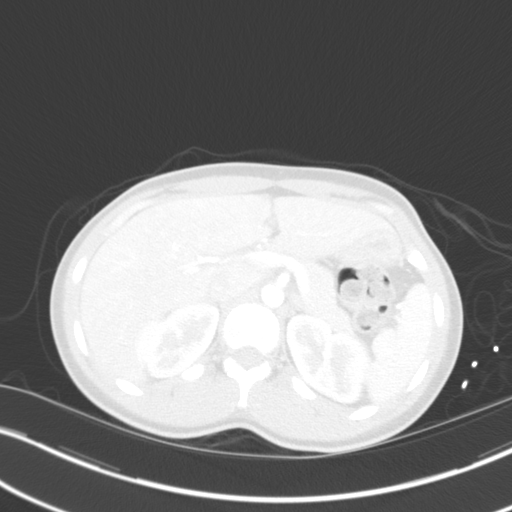
[im 9/61  lung]
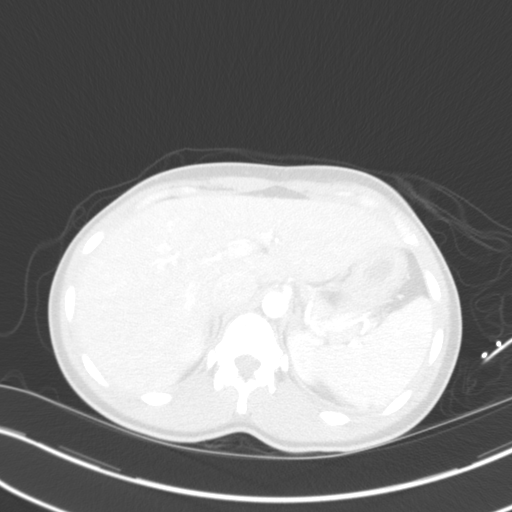
[im 14/61  lung]
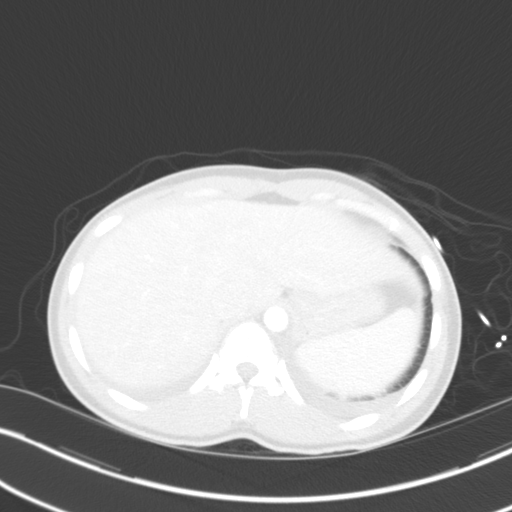
[im 18/61  lung]
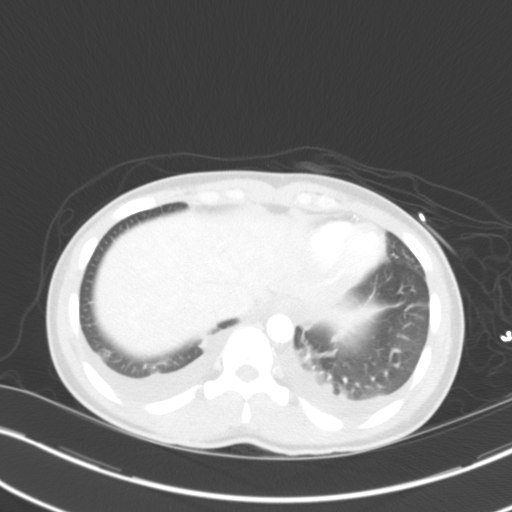
[im 23/61  mediastinal]
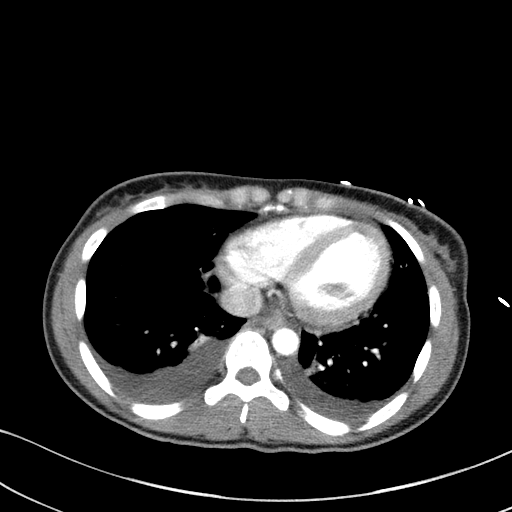
[im 23/61  lung]
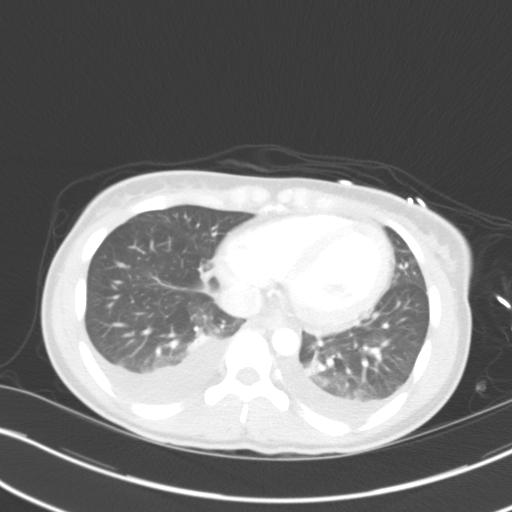
[im 27/61  lung]
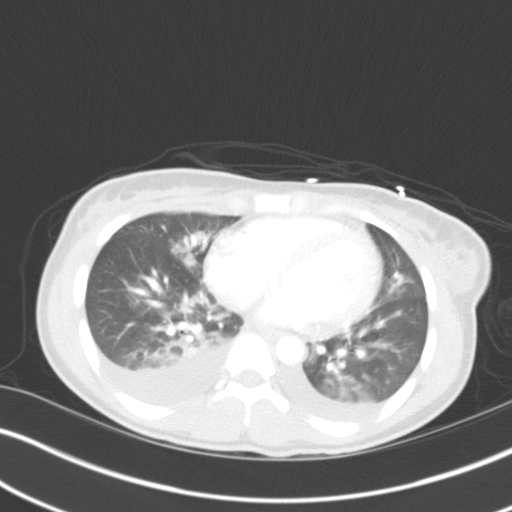
[im 34/61  lung]
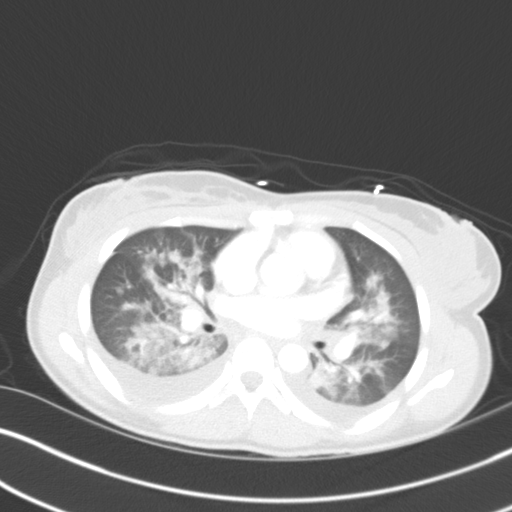
[im 38/61  lung]
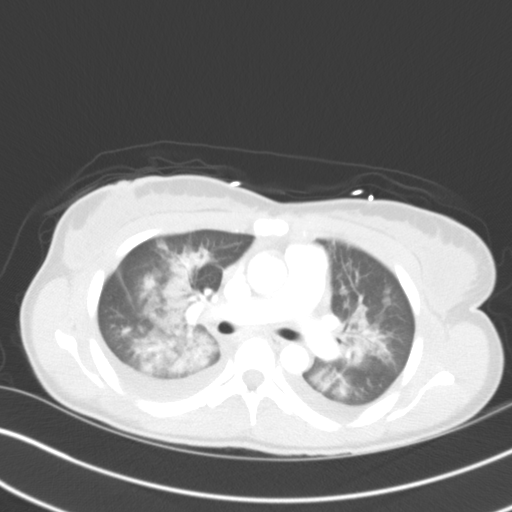
[im 43/61  mediastinal]
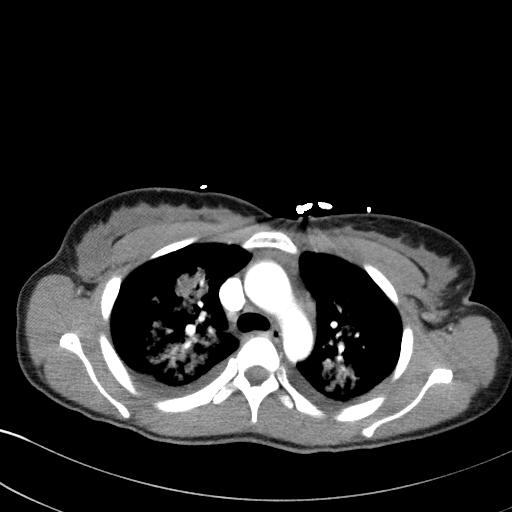
[im 43/61  lung]
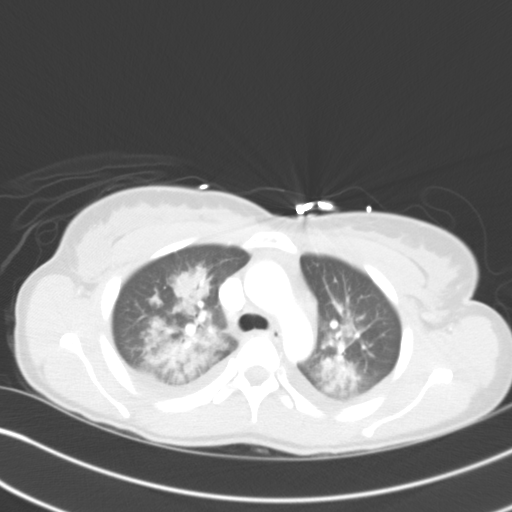
[im 47/61  lung]
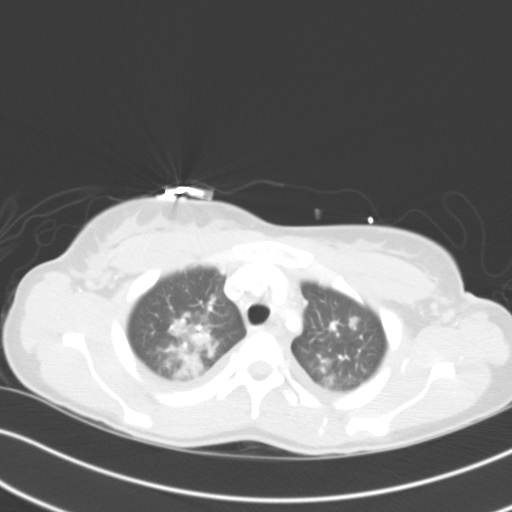
[im 52/61  lung]
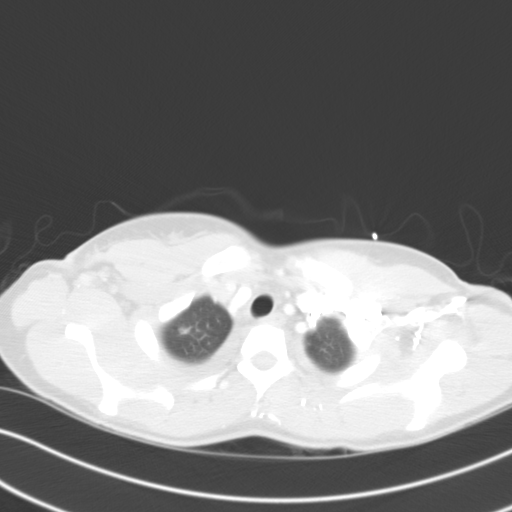
[im 56/61  lung]
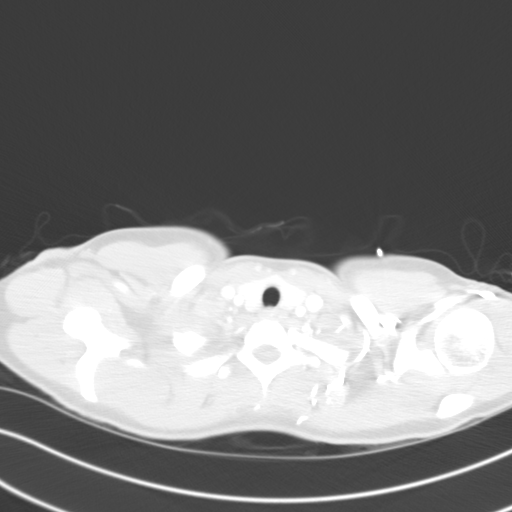

[Series 4: coronal · coronal · 0.59mm/px · 3 of 61 slices shown]
[im 13/61  lung]
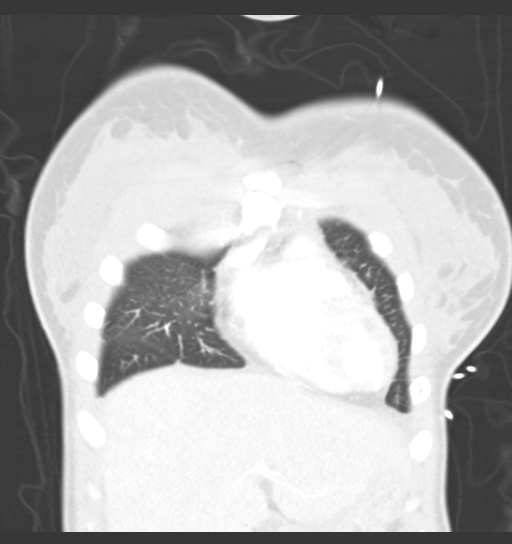
[im 25/61  lung]
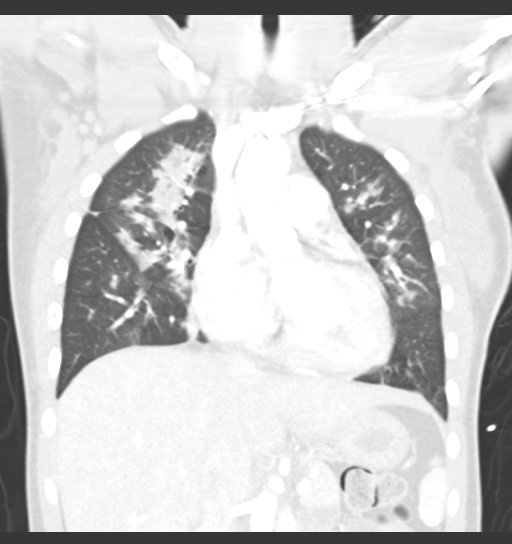
[im 37/61  lung]
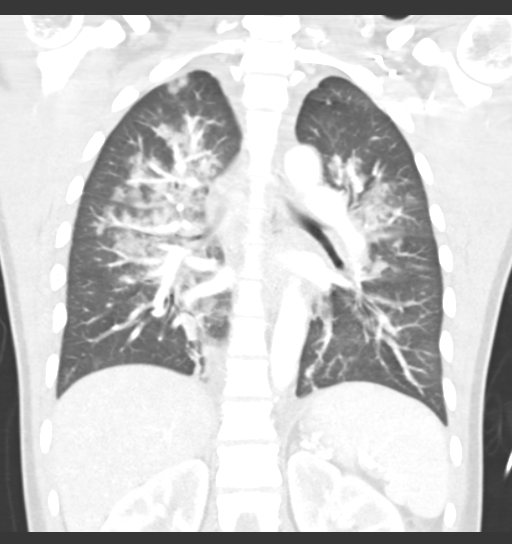

[15 of 36 positions shown; findings below may reference images not displayed]

FINDINGS: There are bilateral pleural effusions. There is extensive airspace
consolidation throughout the lungs bilaterally, predominantly in a
perihilar distribution. There is no appreciable interstitial edema.
There is no appreciable thoracic adenopathy. The pericardium is not
thickened.

In the visualized upper abdomen, there is fatty liver. Visualized
upper abdominal structures otherwise appear normal. There are no
blastic or lytic bone lesions. Thyroid appears normal.
IMPRESSION: Widespread alveolar opacities bilaterally with moderate pleural
effusions bilaterally. This appearance would be consistent with
congestive heart failure. If patient does not have other evidence of
congestive heart failure, other etiologies must be considered
including widespread pneumonia, autoimmune disease, or allergic type
phenomenon of acute nature. There is involvement of all lobes in
segments to a varying degree with this alveolar opacity, although
these findings are primarily in a perihilar distribution. These
findings may well warrant pulmonary consultation with consideration
for bronchoscopy. Sputum cytology also could be helpful to further
assess.

## 2015-02-08 IMAGING — CR DG CHEST 2V
2 series · 2 of 2 positions shown · non-contrast
Comparison: 07/27/2013

CLINICAL DATA: COPD/pneumonia

EXAM:
CHEST  2 VIEW

[w chest pa]
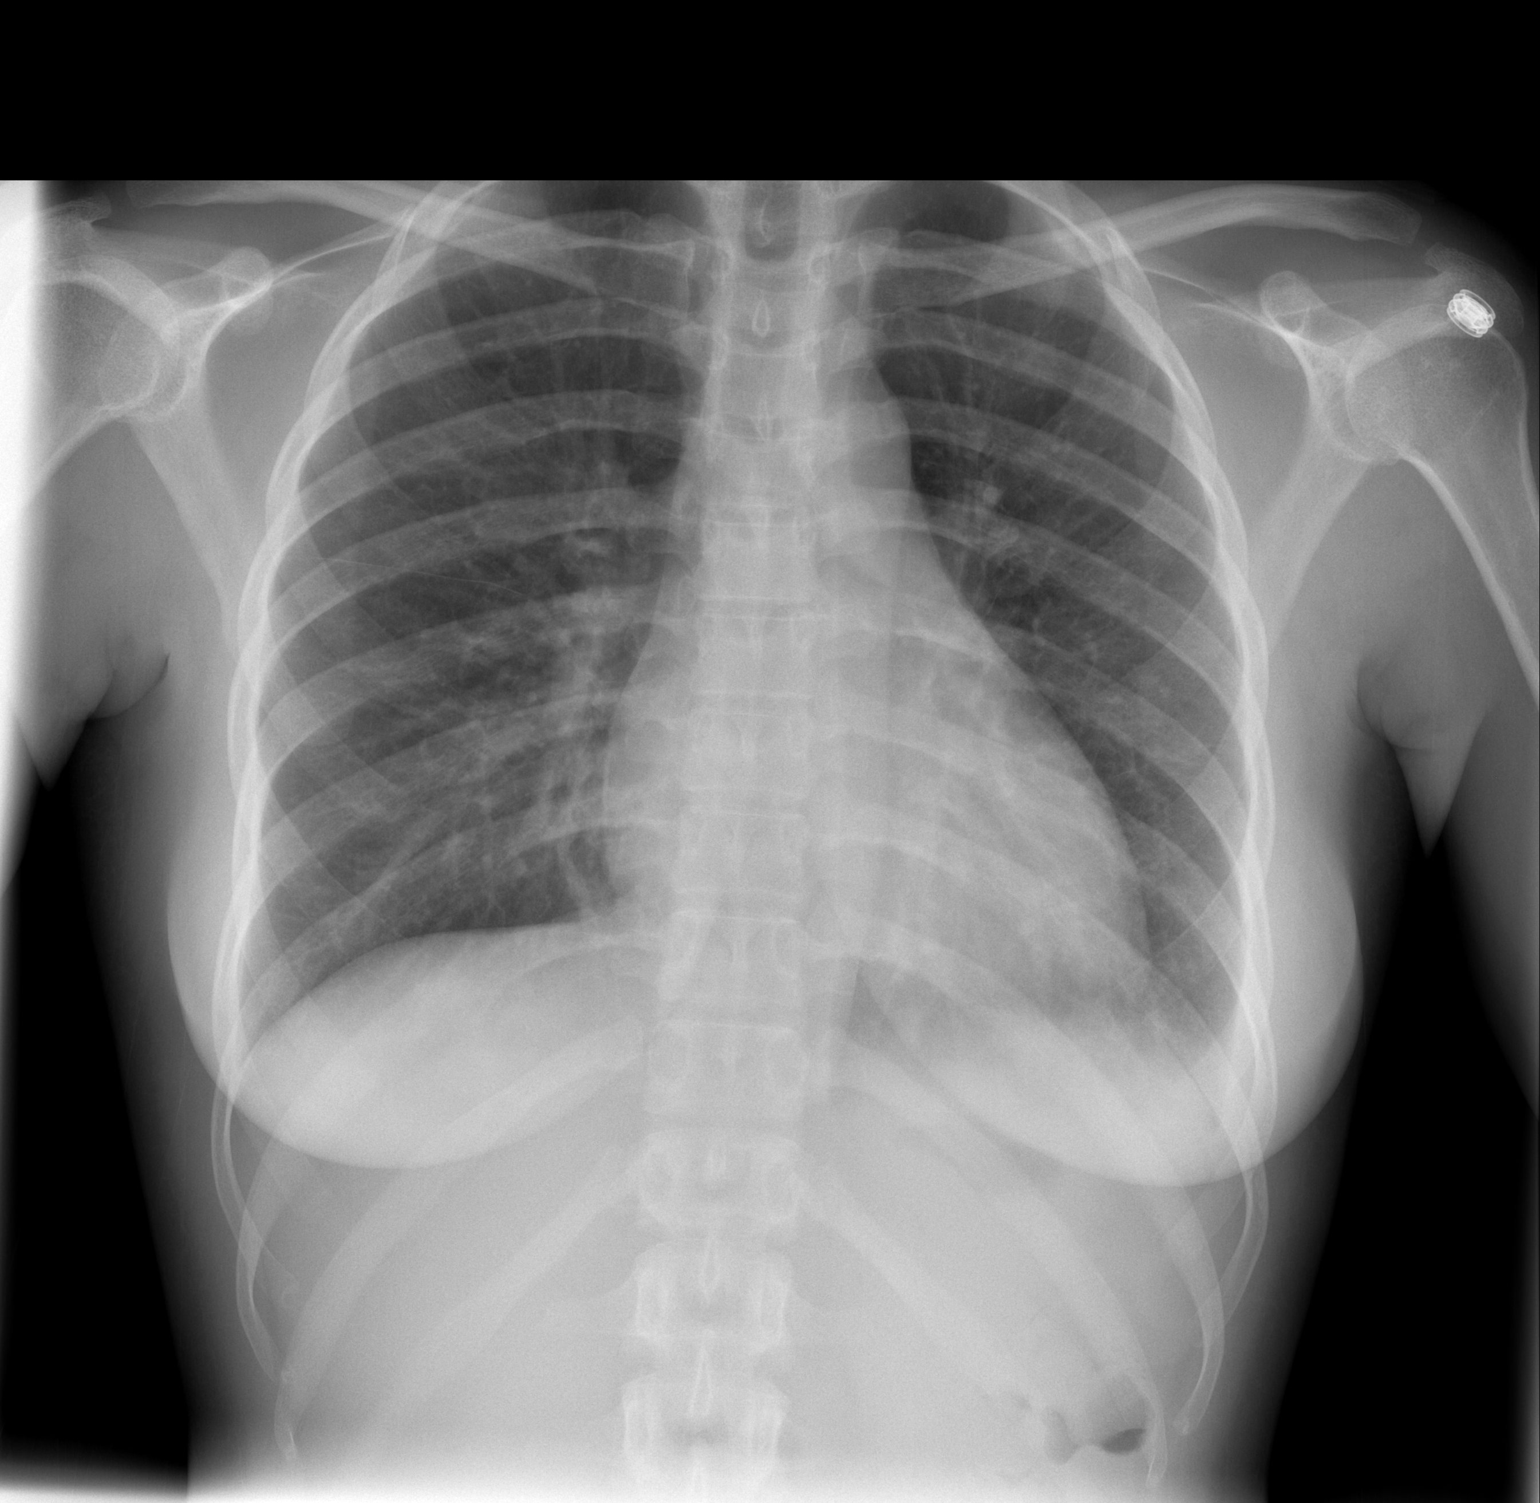

[w chest lat]
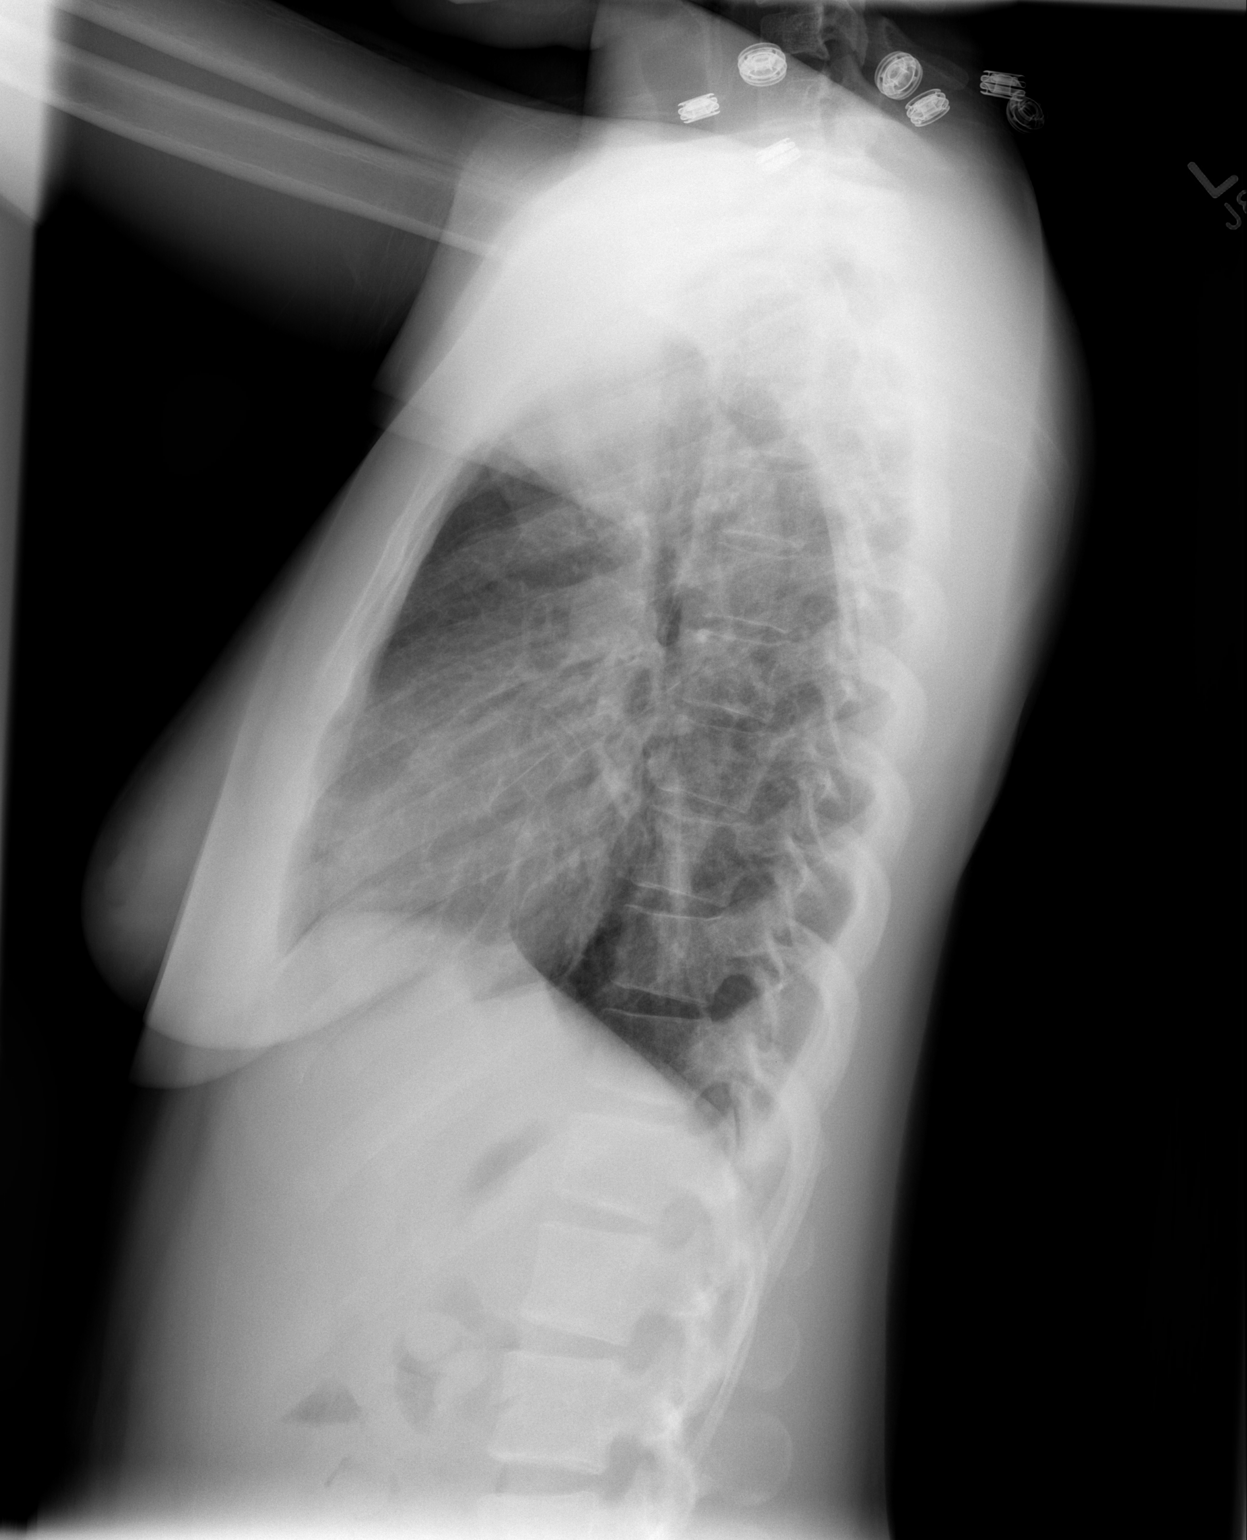

[2 of 2 positions shown; findings below may reference images not displayed]

FINDINGS: Improving streaky bibasilar airspace process. Left hemidiaphragm
remains obscured. Trace left effusion noted. Tiny right effusion has
resolved. No pneumothorax. Normal heart size and vascularity. No
superimposed edema.
IMPRESSION: Improving bibasilar airspace process/pneumonia. Residual trace left
effusion.

## 2015-07-14 ENCOUNTER — Encounter (HOSPITAL_COMMUNITY): Payer: Self-pay | Admitting: Emergency Medicine

## 2015-07-14 ENCOUNTER — Emergency Department (HOSPITAL_COMMUNITY)
Admission: EM | Admit: 2015-07-14 | Discharge: 2015-07-14 | Disposition: A | Discharge status: Home / Self Care | Payer: BLUE CROSS/BLUE SHIELD | Attending: Emergency Medicine | Admitting: Emergency Medicine

## 2015-07-14 DIAGNOSIS — R55 Syncope and collapse: Secondary | ICD-10-CM | POA: Insufficient documentation

## 2015-07-14 DIAGNOSIS — J45909 Unspecified asthma, uncomplicated: Secondary | ICD-10-CM | POA: Insufficient documentation

## 2015-07-14 DIAGNOSIS — R42 Dizziness and giddiness: Secondary | ICD-10-CM | POA: Insufficient documentation

## 2015-07-14 LAB — I-STAT BETA HCG BLOOD, ED (MC, WL, AP ONLY): I-stat hCG, quantitative: <5 m[IU]/mL (ref <5)

## 2015-07-14 LAB — CBC
HEMATOCRIT: 33.7 % — AB (ref 36.0–46.0)
HEMOGLOBIN: 11.3 g/dL — AB (ref 12.0–15.0)
MCH: 30.2 pg (ref 26.0–34.0)
MCHC: 33.5 g/dL (ref 30.0–36.0)
MCV: 90.1 fL (ref 78.0–100.0)
PLATELETS: 370 10*3/uL (ref 150–400)
RBC: 3.74 MIL/uL — AB (ref 3.87–5.11)
RDW: 12.4 % (ref 11.5–15.5)
WBC: 7.5 10*3/uL (ref 4.0–10.5)

## 2015-07-14 LAB — BASIC METABOLIC PANEL
Anion gap: 7 (ref 5–15)
BUN: 13 mg/dL (ref 6–20)
CHLORIDE: 106 mmol/L (ref 101–111)
CO2: 23 mmol/L (ref 22–32)
Calcium: 9 mg/dL (ref 8.9–10.3)
Creatinine, Ser: 0.52 mg/dL (ref 0.44–1.00)
GFR calc non Af Amer: >60 mL/min (ref >60)
Glucose, Bld: 91 mg/dL (ref 65–99)
POTASSIUM: 3.8 mmol/L (ref 3.5–5.1)
SODIUM: 136 mmol/L (ref 135–145)

## 2015-07-14 NOTE — ED Notes (Signed)
Per pt, states she is having a hard time keeping head up-states increased dizziness when she lifts head

## 2015-07-15 ENCOUNTER — Emergency Department (HOSPITAL_COMMUNITY)
Admission: EM | Admit: 2015-07-15 | Discharge: 2015-07-15 | Disposition: A | Discharge status: Home / Self Care | Payer: Self-pay | Attending: Emergency Medicine | Admitting: Emergency Medicine

## 2015-07-15 ENCOUNTER — Encounter (HOSPITAL_COMMUNITY): Payer: Self-pay | Admitting: Emergency Medicine

## 2015-07-15 ENCOUNTER — Emergency Department (HOSPITAL_COMMUNITY): Payer: Self-pay

## 2015-07-15 DIAGNOSIS — H748X1 Other specified disorders of right middle ear and mastoid: Secondary | ICD-10-CM | POA: Insufficient documentation

## 2015-07-15 DIAGNOSIS — J45909 Unspecified asthma, uncomplicated: Secondary | ICD-10-CM | POA: Insufficient documentation

## 2015-07-15 DIAGNOSIS — Z9851 Tubal ligation status: Secondary | ICD-10-CM | POA: Insufficient documentation

## 2015-07-15 DIAGNOSIS — R55 Syncope and collapse: Secondary | ICD-10-CM | POA: Insufficient documentation

## 2015-07-15 DIAGNOSIS — H538 Other visual disturbances: Secondary | ICD-10-CM | POA: Insufficient documentation

## 2015-07-15 DIAGNOSIS — R51 Headache: Secondary | ICD-10-CM | POA: Insufficient documentation

## 2015-07-15 DIAGNOSIS — H9190 Unspecified hearing loss, unspecified ear: Secondary | ICD-10-CM | POA: Insufficient documentation

## 2015-07-15 DIAGNOSIS — Z87891 Personal history of nicotine dependence: Secondary | ICD-10-CM | POA: Insufficient documentation

## 2015-07-15 DIAGNOSIS — Z8701 Personal history of pneumonia (recurrent): Secondary | ICD-10-CM | POA: Insufficient documentation

## 2015-07-15 DIAGNOSIS — R110 Nausea: Secondary | ICD-10-CM | POA: Insufficient documentation

## 2015-07-15 DIAGNOSIS — R42 Dizziness and giddiness: Secondary | ICD-10-CM | POA: Insufficient documentation

## 2015-07-15 DIAGNOSIS — Z86018 Personal history of other benign neoplasm: Secondary | ICD-10-CM | POA: Insufficient documentation

## 2015-07-15 DIAGNOSIS — Z8742 Personal history of other diseases of the female genital tract: Secondary | ICD-10-CM | POA: Insufficient documentation

## 2015-07-15 DIAGNOSIS — Z3202 Encounter for pregnancy test, result negative: Secondary | ICD-10-CM | POA: Insufficient documentation

## 2015-07-15 DIAGNOSIS — Z8679 Personal history of other diseases of the circulatory system: Secondary | ICD-10-CM | POA: Insufficient documentation

## 2015-07-15 LAB — CBC WITH DIFFERENTIAL/PLATELET
BASOS PCT: 0 %
Basophils Absolute: 0 10*3/uL (ref 0.0–0.1)
EOS ABS: 0.1 10*3/uL (ref 0.0–0.7)
Eosinophils Relative: 2 %
HCT: 33.8 % — ABNORMAL LOW (ref 36.0–46.0)
HEMOGLOBIN: 11.4 g/dL — AB (ref 12.0–15.0)
Lymphocytes Relative: 33 %
Lymphs Abs: 2.5 10*3/uL (ref 0.7–4.0)
MCH: 31.1 pg (ref 26.0–34.0)
MCHC: 33.7 g/dL (ref 30.0–36.0)
MCV: 92.1 fL (ref 78.0–100.0)
MONOS PCT: 3 %
Monocytes Absolute: 0.3 10*3/uL (ref 0.1–1.0)
NEUTROS PCT: 62 %
Neutro Abs: 4.6 10*3/uL (ref 1.7–7.7)
Platelets: 350 10*3/uL (ref 150–400)
RBC: 3.67 MIL/uL — AB (ref 3.87–5.11)
RDW: 12.5 % (ref 11.5–15.5)
WBC: 7.4 10*3/uL (ref 4.0–10.5)

## 2015-07-15 LAB — BASIC METABOLIC PANEL
Anion gap: 4 — ABNORMAL LOW (ref 5–15)
BUN: 14 mg/dL (ref 6–20)
CHLORIDE: 108 mmol/L (ref 101–111)
CO2: 27 mmol/L (ref 22–32)
CREATININE: 0.61 mg/dL (ref 0.44–1.00)
Calcium: 8.9 mg/dL (ref 8.9–10.3)
Glucose, Bld: 87 mg/dL (ref 65–99)
POTASSIUM: 3.9 mmol/L (ref 3.5–5.1)
SODIUM: 139 mmol/L (ref 135–145)

## 2015-07-15 LAB — URINALYSIS, ROUTINE W REFLEX MICROSCOPIC
BILIRUBIN URINE: NEGATIVE
Glucose, UA: NEGATIVE mg/dL
HGB URINE DIPSTICK: NEGATIVE
Ketones, ur: NEGATIVE mg/dL
Leukocytes, UA: NEGATIVE
NITRITE: NEGATIVE
PROTEIN: NEGATIVE mg/dL
SPECIFIC GRAVITY, URINE: 1.025 (ref 1.005–1.030)
pH: 7 (ref 5.0–8.0)

## 2015-07-15 LAB — POC URINE PREG, ED: PREG TEST UR: NEGATIVE

## 2015-07-15 MED ORDER — KETOROLAC TROMETHAMINE 30 MG/ML IJ SOLN
30.0000 mg | Freq: Once | INTRAMUSCULAR | Status: AC
  Administered 2015-07-15: 30 mg via INTRAVENOUS
  Filled 2015-07-15: qty 1

## 2015-07-15 MED ORDER — PROMETHAZINE HCL 25 MG PO TABS: 25.0000 mg | ORAL_TABLET | Freq: Four times a day (QID) | ORAL | Status: DC | PRN

## 2015-07-15 MED ORDER — SODIUM CHLORIDE 0.9 % IV BOLUS (SEPSIS)
1000.0000 mL | Freq: Once | INTRAVENOUS | Status: AC
  Administered 2015-07-15: 1000 mL via INTRAVENOUS

## 2015-07-15 MED ORDER — MECLIZINE HCL 25 MG PO TABS
25.0000 mg | ORAL_TABLET | Freq: Once | ORAL | Status: AC
  Administered 2015-07-15: 25 mg via ORAL
  Filled 2015-07-15: qty 1

## 2015-07-15 MED ORDER — MECLIZINE HCL 25 MG PO TABS: 25.0000 mg | ORAL_TABLET | Freq: Three times a day (TID) | ORAL | Status: DC | PRN

## 2015-07-15 NOTE — ED Notes (Signed)
Pt states she's felt light headed, dizzy, "feels like everything is moving," headache, occasional blurred vision. Denies injury to head, fall, denies use of blood thinners or birth control. Dizziness worse with moving around.

## 2015-07-15 NOTE — ED Provider Notes (Signed)
CSN: [id number]     Arrival date & time 07/15/15  1315 History   First MD Initiated Contact with Patient 07/15/15 1456     Chief Complaint  Patient presents with  . Dizziness  . Headache  . Near Syncope   HPI  Madison Copeland is a 30 year-old female presenting with headache and dizziness. Onset of symptoms was 2 days ago. She describes her headache as intermittent "spiking "pain in her occipital region. denis specific exacerbating factors. She states that she has also had a mild, constant, generalized headache for the past 2 months that worsened 2 days ago. She has taken ibuprofen for her headache which resolves the mild headache but has not improved the intermittent, spiking pain. She reports a history of migraines consistent with her generalized headache but she states that the sharper pains are new. She is also complaining of room spinning dizziness. She states that turning her head causes everything to move around her. She reports associated nausea and occasional blurred vision with these episodes. She notes that if she sits still and faces forward this position will resolve the room spinning. She denies speech difficulty, facial drooping, loss of vision, numbness in the extremities or weakness in the extremities. She is also complaining of right ear fullness. She states that it feels muffled and like her hearing is somewhat decreased. She denies pain or drainage. She denies other URI symptoms including fevers, chills, eye discharge, nasal congestion, rhinorrhea, sore throat or cough. She presented to the emergency department yesterday to evaluate these symptoms but the wait was too long so she left. She had a documented heart rate of 206 by the triage nurses before she left. She does have a history of SVT and reports 4 episodes of palpitations in the past 2 months. She has never followed up with cardiology for this. She denies current palpitations, shortness of breath or chest pain. She states that her  dizziness occurs without sensation of SVT. She has no other complaints today.  Past Medical History  Diagnosis Date  . Asthma   . Migraine   . Fibroid, uterine     small intramural  . Tobacco abuse   . Anemia     during pregnancy  . Ovarian cyst rupture 07/22/13  . Cryptogenic organizing pneumonia [facility])    Past Surgical History  Procedure Laterality Date  . Cesarean section      x 2  . Tubal ligation     Family History  Problem Relation Age of Onset  . Diabetes Mother   . Heart disease Mother   . Cervical cancer Maternal Aunt   . Kidney disease Maternal Grandmother    Social History  Substance Use Topics  . Smoking status: Former Smoker -- 0.25 packs/day for 15 years    Types: Cigarettes    Quit date: 07/08/2013  . Smokeless tobacco: Never Used  . Alcohol Use: Yes     Comment: occasion   OB History    Gravida Para Term Preterm AB TAB SAB Ectopic Multiple Living   3    1  1   2      Review of Systems  Constitutional: Negative for fever and chills.  HENT: Positive for hearing loss (ear fullness). Negative for congestion, ear discharge, ear pain, rhinorrhea, sinus pressure and sore throat.   Eyes: Positive for visual disturbance. Negative for photophobia and discharge.  Respiratory: Negative for cough and shortness of breath.   Cardiovascular: Negative for chest pain and palpitations.  Gastrointestinal:  Positive for nausea. Negative for vomiting, abdominal pain and diarrhea.  Musculoskeletal: Negative for neck pain and neck stiffness.  Neurological: Positive for dizziness and headaches. Negative for syncope, facial asymmetry, speech difficulty, weakness and numbness.  Psychiatric/Behavioral: Negative for confusion.  All other systems reviewed and are negative.     Allergies  Zofran  Home Medications   Prior to Admission medications   Medication Sig Start Date End Date Taking? Authorizing Provider  ibuprofen (ADVIL,MOTRIN) 200 MG tablet Take 200 mg by mouth  every 6 (six) hours as needed for fever, headache, mild pain, moderate pain or cramping.   Yes Historical Provider, MD  meclizine (ANTIVERT) 25 MG tablet Take 1 tablet (25 mg total) by mouth 3 (three) times daily as needed for dizziness. 07/15/15   Malachy Coleman, PA-C  omeprazole (PRILOSEC) 40 MG capsule One tablet by mouth twice daily x 1 week, then reduce to once daily Patient not taking: Reported on 01/29/2015 11/19/14   Ladene Artist, MD  omeprazole (PRILOSEC) 40 MG capsule Take 1 capsule (40 mg total) by mouth daily. Patient not taking: Reported on 01/29/2015 11/19/14   Ladene Artist, MD  promethazine (PHENERGAN) 25 MG tablet Take 1 tablet (25 mg total) by mouth every 6 (six) hours as needed for nausea or vomiting. 07/15/15   Lahoma Crocker Kynadi Dragos, PA-C   BP 129/80 mmHg  Pulse 76  Temp(Src) 98.1 F (36.7 C) (Oral)  Resp 15  SpO2 99%  LMP 07/03/2015 Physical Exam  Constitutional: She appears well-developed and well-nourished. No distress.  Nontoxic appearing  HENT:  Head: Normocephalic and atraumatic.  Right Ear: Ear canal normal. A middle ear effusion is present.  Left Ear: Tympanic membrane and ear canal normal.  Mouth/Throat: Oropharynx is clear and moist. No oropharyngeal exudate.  Eyes: Conjunctivae and EOM are normal. Pupils are equal, round, and reactive to light. Right eye exhibits no discharge. Left eye exhibits no discharge.  Horizontal nystagmus.   Neck: Normal range of motion. Neck supple. No rigidity.  No meningeal signs. Pt reports severe dizziness upon testing neck ROM.   Cardiovascular: Normal rate, regular rhythm and normal heart sounds.   Pulmonary/Chest: Effort normal and breath sounds normal. No respiratory distress.  Abdominal: Soft. There is no tenderness. There is no rebound and no guarding.  Musculoskeletal: Normal range of motion.  Neurological: She is alert. No cranial nerve deficit. She exhibits normal muscle tone. Coordination normal.  Cranial nerves 3-12  tested and intact. 5/5 strength of all major muscle groups. Sensation to light touch intact throughout. Finger to nose coordinated. Deferred testing gait as pt reports severe dizziness upon standing  Skin: Skin is warm and dry.  Psychiatric: She has a normal mood and affect. Her behavior is normal.  Nursing note and vitals reviewed.   ED Course  Procedures (including critical care time) Labs Review Labs Reviewed  CBC WITH DIFFERENTIAL/PLATELET - Abnormal; Notable for the following:    RBC 3.67 (*)    Hemoglobin 11.4 (*)    HCT 33.8 (*)    All other components within normal limits  BASIC METABOLIC PANEL - Abnormal; Notable for the following:    Anion gap 4 (*)    All other components within normal limits  URINALYSIS, ROUTINE W REFLEX MICROSCOPIC (NOT AT [facility]) - Abnormal; Notable for the following:    APPearance CLOUDY (*)    All other components within normal limits  POC URINE PREG, ED    Imaging Review Ct Head Wo Contrast  07/15/2015  CLINICAL  DATA:  Lightheadedness and dizziness with headache and blurred vision, acute onset. EXAM: CT HEAD WITHOUT CONTRAST TECHNIQUE: Contiguous axial images were obtained from the base of the skull through the vertex without intravenous contrast. COMPARISON:  None. FINDINGS: The brain has a normal appearance without evidence of malformation, atrophy, old or acute infarction, mass lesion, hemorrhage, hydrocephalus or extra-axial collection. The calvarium is unremarkable. The paranasal sinuses, middle ears and mastoids are clear. IMPRESSION: Normal head CT Electronically Signed   By: Nelson Chimes M.D.   On: 07/15/2015 16:00   I have personally reviewed and evaluated these images and lab results as part of my medical decision-making.   EKG Interpretation None      MDM   Final diagnoses:  Vertigo   30 year old female presenting with dizziness, HA and ear fullness x 2 days. Afebrile and hemodynamically stable. Non-focal neuro exam. A few beats of  horizontal nystagmus noted. No vertical or rotational nystagmus. Patient normal finger-nose and normal gait after receiving meclizine.  Right middle ear effusion noted. Hgb at baseline. Blood work largely unremarkable. Negative Preg. CT head negative. Doubt CVA or other central cause of vertigo. History and physical consistent with peripheral vertigo symptoms. Pt symptoms improved with meclizine and toradol. We'll discharge home with meclizine. Patient instructed to followup with her primary care physician or neurology within 3 days for further evaluation. They are to return to the emergency department for new neurologic symptoms, loss of vision or other concerning symptoms. Also discussed that she needs to follow up with her cardiologist at [facility] for recurrent episodes of SVT. No documented tachycardia while in ED. Pt states understanding and is stable for discharge.     Lahoma Crocker Tommie Dejoseph, PA-C 07/15/15 1802  Charlesetta Shanks, MD 07/16/15 1413

## 2015-07-15 NOTE — Progress Notes (Signed)
EDCM spoke to patient at bedside. Patient confirms she does not have a pcp or insurance living in [place].  [facility] provided patient with contact infromation to [facility], informed patient of services there.  EDCM also provided patient with list of pcps who accept self pay patients, list of discount pharmacies and websites needymeds.org and GoodRX.com for medication assistance, phone number to inquire about the orange card, phone number to inquire about Medicaid, phone number to inquire about the [place], financial resources in the community such as local churches, [employer], urban ministries, and dental assistance for uninsured patients.  Patient thankful  for resources.  No further EDCM needs at this time.  EDCM discussed orange card program with patient.  Patient reports she will have insurance next month.  [facility] provided patient with coupon for Meclizine from GoodRx.com to assist with cost.  Patient thankful for resources.  No further EDCM needs at this time.

## 2015-07-15 NOTE — Discharge Instructions (Signed)
Benign Positional Vertigo Vertigo is the feeling that you or your surroundings are moving when they are not. Benign positional vertigo is the most common form of vertigo. The cause of this condition is not serious (is benign). This condition is triggered by certain movements and positions (is positional). This condition can be dangerous if it occurs while you are doing something that could endanger you or others, such as driving.  CAUSES In many cases, the cause of this condition is not known. It may be caused by a disturbance in an area of the inner ear that helps your brain to sense movement and balance. This disturbance can be caused by a viral infection (labyrinthitis), head injury, or repetitive motion. RISK FACTORS This condition is more likely to develop in:  Women.  People who are 50 years of age or older. SYMPTOMS Symptoms of this condition usually happen when you move your head or your eyes in different directions. Symptoms may start suddenly, and they usually last for less than a minute. Symptoms may include:  Loss of balance and falling.  Feeling like you are spinning or moving.  Feeling like your surroundings are spinning or moving.  Nausea and vomiting.  Blurred vision.  Dizziness.  Involuntary eye movement (nystagmus). Symptoms can be mild and cause only slight annoyance, or they can be severe and interfere with daily life. Episodes of benign positional vertigo may return (recur) over time, and they may be triggered by certain movements. Symptoms may improve over time. DIAGNOSIS This condition is usually diagnosed by medical history and a physical exam of the head, neck, and ears. You may be referred to a health care provider who specializes in ear, nose, and throat (ENT) problems (otolaryngologist) or a provider who specializes in disorders of the nervous system (neurologist). You may have additional testing, including:  MRI.  A CT scan.  Eye movement tests. Your  health care provider may ask you to change positions quickly while he or she watches you for symptoms of benign positional vertigo, such as nystagmus. Eye movement may be tested with an electronystagmogram (ENG), caloric stimulation, the Dix-Hallpike test, or the roll test.  An electroencephalogram (EEG). This records electrical activity in your brain.  Hearing tests. TREATMENT Usually, your health care provider will treat this by moving your head in specific positions to adjust your inner ear back to normal. Surgery may be needed in severe cases, but this is rare. In some cases, benign positional vertigo may resolve on its own in 2-4 weeks. HOME CARE INSTRUCTIONS Safety  Move slowly.Avoid sudden body or head movements.  Avoid driving.  Avoid operating heavy machinery.  Avoid doing any tasks that would be dangerous to you or others if a vertigo episode would occur.  If you have trouble walking or keeping your balance, try using a cane for stability. If you feel dizzy or unstable, sit down right away.  Return to your normal activities as told by your health care provider. Ask your health care provider what activities are safe for you. General Instructions  Take over-the-counter and prescription medicines only as told by your health care provider.  Avoid certain positions or movements as told by your health care provider.  Drink enough fluid to keep your urine clear or pale yellow.  Keep all follow-up visits as told by your health care provider. This is important. SEEK MEDICAL CARE IF:  You have a fever.  Your condition gets worse or you develop new symptoms.  Your family or friends   notice any behavioral changes.  Your nausea or vomiting gets worse.  You have numbness or a "pins and needles" sensation. SEEK IMMEDIATE MEDICAL CARE IF:  You have difficulty speaking or moving.  You are always dizzy.  You faint.  You develop severe headaches.  You have weakness in your  legs or arms.  You have changes in your hearing or vision.  You develop a stiff neck.  You develop sensitivity to light.   This information is not intended to replace advice given to you by your health care provider. Make sure you discuss any questions you have with your health care provider.   Document Released: 12/27/2005 Document Revised: 12/10/2014 Document Reviewed: 07/14/2014 Elsevier Interactive Patient Education 2016 [employer]  

## 2015-07-15 NOTE — ED Notes (Signed)
Ambulated pt in hallway. Pt C/O of dizziness while walking.

## 2015-07-15 NOTE — ED Notes (Signed)
Nurse drawing labs. 

## 2015-08-26 ENCOUNTER — Emergency Department (HOSPITAL_COMMUNITY)
Admission: EM | Admit: 2015-08-26 | Discharge: 2015-08-26 | Disposition: A | Discharge status: Home / Self Care | Payer: BLUE CROSS/BLUE SHIELD | Attending: Emergency Medicine | Admitting: Emergency Medicine

## 2015-08-26 ENCOUNTER — Encounter (HOSPITAL_COMMUNITY): Payer: Self-pay | Admitting: Emergency Medicine

## 2015-08-26 ENCOUNTER — Emergency Department (HOSPITAL_COMMUNITY): Payer: BLUE CROSS/BLUE SHIELD

## 2015-08-26 DIAGNOSIS — R002 Palpitations: Secondary | ICD-10-CM

## 2015-08-26 DIAGNOSIS — J45909 Unspecified asthma, uncomplicated: Secondary | ICD-10-CM | POA: Insufficient documentation

## 2015-08-26 DIAGNOSIS — Z87891 Personal history of nicotine dependence: Secondary | ICD-10-CM | POA: Insufficient documentation

## 2015-08-26 DIAGNOSIS — Z79899 Other long term (current) drug therapy: Secondary | ICD-10-CM | POA: Insufficient documentation

## 2015-08-26 DIAGNOSIS — Z791 Long term (current) use of non-steroidal anti-inflammatories (NSAID): Secondary | ICD-10-CM | POA: Insufficient documentation

## 2015-08-26 LAB — CBC WITH DIFFERENTIAL/PLATELET
BASOS ABS: 0 10*3/uL (ref 0.0–0.1)
BASOS PCT: 0 %
Eosinophils Absolute: 0.3 10*3/uL (ref 0.0–0.7)
Eosinophils Relative: 4 %
HEMATOCRIT: 35.9 % — AB (ref 36.0–46.0)
HEMOGLOBIN: 12.1 g/dL (ref 12.0–15.0)
Lymphocytes Relative: 36 %
Lymphs Abs: 2.5 10*3/uL (ref 0.7–4.0)
MCH: 30.2 pg (ref 26.0–34.0)
MCHC: 33.7 g/dL (ref 30.0–36.0)
MCV: 89.5 fL (ref 78.0–100.0)
MONOS PCT: 5 %
Monocytes Absolute: 0.4 10*3/uL (ref 0.1–1.0)
NEUTROS ABS: 3.8 10*3/uL (ref 1.7–7.7)
NEUTROS PCT: 55 %
Platelets: 351 10*3/uL (ref 150–400)
RBC: 4.01 MIL/uL (ref 3.87–5.11)
RDW: 12.5 % (ref 11.5–15.5)
WBC: 7 10*3/uL (ref 4.0–10.5)

## 2015-08-26 LAB — BASIC METABOLIC PANEL
ANION GAP: 8 (ref 5–15)
BUN: 17 mg/dL (ref 6–20)
CALCIUM: 9.1 mg/dL (ref 8.9–10.3)
CO2: 20 mmol/L — AB (ref 22–32)
Chloride: 110 mmol/L (ref 101–111)
Creatinine, Ser: 0.62 mg/dL (ref 0.44–1.00)
GFR calc Af Amer: >60 mL/min (ref >60)
GFR calc non Af Amer: >60 mL/min (ref >60)
Glucose, Bld: 78 mg/dL (ref 65–99)
Potassium: 3.8 mmol/L (ref 3.5–5.1)
Sodium: 138 mmol/L (ref 135–145)

## 2015-08-26 LAB — TROPONIN I: Troponin I: <0.03 ng/mL (ref <0.031)

## 2015-08-26 NOTE — ED Notes (Signed)
Pt c/o SOB and "heart racing" x 1 month. Pt states it is getting progressively worse. Pt reports she was seen previously for same and dx with vertigo. Denies N/V/D.

## 2015-08-26 NOTE — ED Provider Notes (Signed)
CSN: [id number]     Arrival date & time 08/26/15  1422 History   First MD Initiated Contact with Patient 08/26/15 1622     Chief Complaint  Patient presents with  . Shortness of Breath     (Consider location/radiation/quality/duration/timing/severity/associated sxs/prior Treatment) HPI Madison Copeland is a 30 y.o. female with history of asthma, here for evaluation of shortness of breath. Patient reports over the past month she has experienced intermittent palpitations with associated chest pain and shortness of breath. These symptoms well last for seconds to minutes and resolved spontaneously. They're not associated with exertion. Worse with deep respiration. She reports they have been gradually worsening so she came to ED today for evaluation. No active chest pain or shortness of breath now. She denies any fevers, cough, chills, leg swelling, abdominal pain, nausea or vomiting, diaphoresis. No other modifying factors. Patient does note that she has been seen for this problem in the past and given a referral, but did not follow up "because she lost the papers".  Past Medical History  Diagnosis Date  . Asthma   . Migraine   . Fibroid, uterine     small intramural  . Tobacco abuse   . Anemia     during pregnancy  . Ovarian cyst rupture 07/22/13  . Cryptogenic organizing pneumonia [facility])    Past Surgical History  Procedure Laterality Date  . Cesarean section      x 2  . Tubal ligation     Family History  Problem Relation Age of Onset  . Diabetes Mother   . Heart disease Mother   . Cervical cancer Maternal Aunt   . Kidney disease Maternal Grandmother    Social History  Substance Use Topics  . Smoking status: Former Smoker -- 0.25 packs/day for 15 years    Types: Cigarettes    Quit date: 07/08/2013  . Smokeless tobacco: Never Used  . Alcohol Use: Yes     Comment: occasion   OB History    Gravida Para Term Preterm AB TAB SAB Ectopic Multiple Living   3    1  1   2      Review  of Systems A 10 point review of systems was completed and was negative except for pertinent positives and negatives as mentioned in the history of present illness     Allergies  Zofran  Home Medications   Prior to Admission medications   Medication Sig Start Date End Date Taking? Authorizing Provider  ibuprofen (ADVIL,MOTRIN) 200 MG tablet Take 800 mg by mouth every 6 (six) hours as needed for mild pain or moderate pain.    Yes Historical Provider, MD  meclizine (ANTIVERT) 25 MG tablet Take 1 tablet (25 mg total) by mouth 3 (three) times daily as needed for dizziness. 07/15/15  Yes Stevi Barrett, PA-C  omeprazole (PRILOSEC) 40 MG capsule One tablet by mouth twice daily x 1 week, then reduce to once daily Patient not taking: Reported on 01/29/2015 11/19/14   Ladene Artist, MD  omeprazole (PRILOSEC) 40 MG capsule Take 1 capsule (40 mg total) by mouth daily. Patient not taking: Reported on 01/29/2015 11/19/14   Ladene Artist, MD  promethazine (PHENERGAN) 25 MG tablet Take 1 tablet (25 mg total) by mouth every 6 (six) hours as needed for nausea or vomiting. Patient not taking: Reported on 08/26/2015 07/15/15   Stevi Barrett, PA-C   BP 136/92 mmHg  Pulse 69  Temp(Src) 98.4 F (36.9 C) (Oral)  Resp 19  SpO2  100%  LMP 08/26/2015 Physical Exam  Constitutional: She is oriented to person, place, and time. She appears well-developed and well-nourished.  HENT:  Head: Normocephalic and atraumatic.  Mouth/Throat: Oropharynx is clear and moist.  Eyes: Conjunctivae are normal. Pupils are equal, round, and reactive to light. Right eye exhibits no discharge. Left eye exhibits no discharge. No scleral icterus.  Neck: Neck supple.  Cardiovascular: Normal rate, regular rhythm and normal heart sounds.   Pulmonary/Chest: Effort normal and breath sounds normal. No respiratory distress. She has no wheezes. She has no rales.  Abdominal: Soft. There is no tenderness.  Musculoskeletal: She exhibits no edema  or tenderness.  Neurological: She is alert and oriented to person, place, and time.  Cranial Nerves II-XII grossly intact  Skin: Skin is warm and dry. No rash noted.  Psychiatric: She has a normal mood and affect.  Nursing note and vitals reviewed.   ED Course  Procedures (including critical care time) Labs Review Labs Reviewed  BASIC METABOLIC PANEL - Abnormal; Notable for the following:    CO2 20 (*)    All other components within normal limits  CBC WITH DIFFERENTIAL/PLATELET - Abnormal; Notable for the following:    HCT 35.9 (*)    All other components within normal limits  TROPONIN I    Imaging Review Dg Chest 2 View  08/26/2015  CLINICAL DATA:  Shortness of breath an lightheaded for 3 days EXAM: CHEST  2 VIEW COMPARISON:  January 29, 2015 FINDINGS: The heart size and mediastinal contours are within normal limits. There is no focal infiltrate, pulmonary edema, or pleural effusion. The visualized skeletal structures are unremarkable. IMPRESSION: No active cardiopulmonary disease. Electronically Signed   By: Abelardo Diesel M.D.   On: 08/26/2015 15:20   I have personally reviewed and evaluated these images and lab results as part of my medical decision-making.   EKG Interpretation None     ED ECG REPORT   Date: 08/26/2015  Rate: 70  Rhythm: normal sinus rhythm  QRS Axis: normal  Intervals: normal  ST/T Wave abnormalities: nonspecific T wave changes  Conduction Disutrbances:none  Narrative Interpretation:   Old EKG Reviewed: unchanged  I have personally reviewed the EKG tracing and agree with the computerized printout as noted.   MDM  Madison Copeland is a 30 y.o. female here for evaluation of palpitations, chest pain shortness of breath ongoing for the past month. Symptoms are very atypical for ACS. Low suspicion for pulmonary embolus. On arrival, she is afebrile, hemodynamically stable and 100% on room air.  No objective findings on today's evaluation. No symptoms and  emergency department. EKG is reassuring, troponin negative. Plan to give patient PCP follow-up as well as cardiology referral. Overall, she appears well, nontoxic and appropriate for outpatient follow-up. Final diagnoses:  Palpitations        Comer Locket, PA-C 08/26/15 1924  Harvel Quale, MD 08/30/15 [phone]

## 2015-08-26 NOTE — Discharge Instructions (Signed)
There does not appear to be an emergent cause for your symptoms at this time. Your exam, labs, EKG and chest x-ray are all reassuring. Please follow-up with your doctor or the [facility] for further evaluation. You may also follow-up with cardiology for reevaluation. Return to ED for new or worsening symptoms.  Palpitations A palpitation is the feeling that your heartbeat is irregular or is faster than normal. It may feel like your heart is fluttering or skipping a beat. Palpitations are usually not a serious problem. However, in some cases, you may need further medical evaluation. CAUSES  Palpitations can be caused by:  Smoking.  Caffeine or other stimulants, such as diet pills or energy drinks.  Alcohol.  Stress and anxiety.  Strenuous physical activity.  Fatigue.  Certain medicines.  Heart disease, especially if you have a history of irregular heart rhythms (arrhythmias), such as atrial fibrillation, atrial flutter, or supraventricular tachycardia.  An improperly working pacemaker or defibrillator. DIAGNOSIS  To find the cause of your palpitations, your health care provider will take your medical history and perform a physical exam. Your health care provider may also have you take a test called an ambulatory electrocardiogram (ECG). An ECG records your heartbeat patterns over a 24-hour period. You may also have other tests, such as:  Transthoracic echocardiogram (TTE). During echocardiography, sound waves are used to evaluate how blood flows through your heart.  Transesophageal echocardiogram (TEE).  Cardiac monitoring. This allows your health care provider to monitor your heart rate and rhythm in real time.  Holter monitor. This is a portable device that records your heartbeat and can help diagnose heart arrhythmias. It allows your health care provider to track your heart activity for several days, if needed.  Stress tests by exercise or by giving  medicine that makes the heart beat faster. TREATMENT  Treatment of palpitations depends on the cause of your symptoms and can vary greatly. Most cases of palpitations do not require any treatment other than time, relaxation, and monitoring your symptoms. Other causes, such as atrial fibrillation, atrial flutter, or supraventricular tachycardia, usually require further treatment. HOME CARE INSTRUCTIONS   Avoid:  Caffeinated coffee, tea, soft drinks, diet pills, and energy drinks.  Chocolate.  Alcohol.  Stop smoking if you smoke.  Reduce your stress and anxiety. Things that can help you relax include:  A method of controlling things in your body, such as your heartbeats, with your mind (biofeedback).  Yoga.  Meditation.  Physical activity such as swimming, jogging, or walking.  Get plenty of rest and sleep. SEEK MEDICAL CARE IF:   You continue to have a fast or irregular heartbeat beyond 24 hours.  Your palpitations occur more often. SEEK IMMEDIATE MEDICAL CARE IF:  You have chest pain or shortness of breath.  You have a severe headache.  You feel dizzy or you faint. MAKE SURE YOU:  Understand these instructions.  Will watch your condition.  Will get help right away if you are not doing well or get worse.   This information is not intended to replace advice given to you by your health care provider. Make sure you discuss any questions you have with your health care provider.   Document Released: 03/18/2000 Document Revised: 03/26/2013 Document Reviewed: 05/20/2011 Elsevier Interactive Patient Education 2016 Elsevier Inc.  Holter Monitoring A Holter monitor is a small device that is used to detect abnormal heart rhythms. It clips to your clothing and is connected by wires to flat, sticky disks (  electrodes) that attach to your chest. It is worn continuously for 24-48 hours. HOME CARE INSTRUCTIONS  Wear your Holter monitor at all times, even while exercising and  sleeping, for as long as directed by your health care provider.  Make sure that the Holter monitor is safely clipped to your clothing or close to your body as recommended by your health care provider.  Do not get the monitor or wires wet.  Do not put body lotion or moisturizer on your chest.  Keep your skin clean.  Keep a diary of your daily activities, such as walking and doing chores. If you feel that your heartbeat is abnormal or that your heart is fluttering or skipping a beat:  Record what you are doing when it happens.  Record what time of day the symptoms occur.  Return your Holter monitor as directed by your health care provider.  Keep all follow-up visits as directed by your health care provider. This is important. SEEK IMMEDIATE MEDICAL CARE IF:  You feel lightheaded or you faint.  You have trouble breathing.  You feel pain in your chest, upper arm, or jaw.  You feel sick to your stomach and your skin is pale, cool, or damp.  You heartbeat feels unusual or abnormal.   This information is not intended to replace advice given to you by your health care provider. Make sure you discuss any questions you have with your health care provider.   Document Released: 12/18/2003 Document Revised: 04/11/2014 Document Reviewed: 10/28/2013 Elsevier Interactive Patient Education [employer].

## 2015-09-01 ENCOUNTER — Encounter: Payer: Self-pay | Admitting: Cardiology

## 2015-09-01 ENCOUNTER — Ambulatory Visit (INDEPENDENT_AMBULATORY_CARE_PROVIDER_SITE_OTHER): Payer: Self-pay | Admitting: Cardiology

## 2015-09-01 VITALS — BP 122/80 | HR 68 | Ht 62.0 in | Wt 143.0 lb

## 2015-09-01 DIAGNOSIS — R55 Syncope and collapse: Secondary | ICD-10-CM

## 2015-09-01 DIAGNOSIS — R002 Palpitations: Secondary | ICD-10-CM

## 2015-09-01 DIAGNOSIS — R072 Precordial pain: Secondary | ICD-10-CM

## 2015-09-01 DIAGNOSIS — I471 Supraventricular tachycardia: Secondary | ICD-10-CM

## 2015-09-01 LAB — TSH: TSH: 0.93 mIU/L

## 2015-09-01 NOTE — Progress Notes (Signed)
Cardiology Office Note    Date:  09/01/2015   ID:  Madison Copeland, DOB December 30, 1985, MRN [id number]  PCP:  No PCP Per Patient  Cardiologist:   Ena Dawley, MD  Referring physician: Jola Schmidt, M.D.  History of Present Illness:  Madison Copeland is a 30 y.o. female with prior medical history of asthma, distant tobacco abuse, who has been experiencing palpitations this started approximately 2 months ago. She has had several visits to the ED since then and on one occasion there was an SVT detected in the ambulance that broke when she arrived to the hospital. On 2 occasions she had syncopal episodes that were witnessed and she was unconscious for only a few seconds and there was no urinary or stool incontinence or jerking motions. While in the hospital her troponin was normal. And SVT was never recorded on 12-lead EKG. Her TSH 2 years ago was 2.3. She has 2 healthy children age 7 and 93, no recent pregnancy. There is no family history of myocardial infarction, CHF, or SCD. When she has her episodes of palpitations those are associated with chest pain and shortness of breath.  She had an echocardiogram performed 2 years ago with finding of a possible left atrial mass that was ruled out on cardiac MRI. There was a suggestion of like disease on cardiac MRI and she underwent chest CT in April 2015 that showed: Widespread alveolar opacities bilaterally with moderate pleural effusions bilaterally. This appearance would be consistent with congestive heart failure. If patient does not have other evidence of congestive heart failure, other etiologies must be considered including widespread pneumonia, autoimmune disease, or allergic type phenomenon of acute nature. There is involvement of all lobes in segments to a varying degree with this alveolar opacity, although these findings are primarily in a perihilar distribution. These findings may well warrant pulmonary consultation with consideration for  bronchoscopy. Sputum cytology also could be helpful to further assess.  Past Medical History  Diagnosis Date  . Asthma   . Migraine   . Fibroid, uterine     small intramural  . Tobacco abuse   . Anemia     during pregnancy  . Ovarian cyst rupture 07/22/13  . Cryptogenic organizing pneumonia [facility])     Past Surgical History  Procedure Laterality Date  . Cesarean section      x 2  . Tubal ligation      Current Medications: Outpatient Prescriptions Prior to Visit  Medication Sig Dispense Refill  . ibuprofen (ADVIL,MOTRIN) 200 MG tablet Take 800 mg by mouth every 6 (six) hours as needed for mild pain or moderate pain.     . meclizine (ANTIVERT) 25 MG tablet Take 1 tablet (25 mg total) by mouth 3 (three) times daily as needed for dizziness. 30 tablet 0  . omeprazole (PRILOSEC) 40 MG capsule Take 1 capsule (40 mg total) by mouth daily. 30 capsule 11  . promethazine (PHENERGAN) 25 MG tablet Take 1 tablet (25 mg total) by mouth every 6 (six) hours as needed for nausea or vomiting. 30 tablet 0  . omeprazole (PRILOSEC) 40 MG capsule One tablet by mouth twice daily x 1 week, then reduce to once daily (Patient not taking: Reported on 01/29/2015) 40 capsule 0   No facility-administered medications prior to visit.     Allergies:   Zofran   Social History   Social History  . Marital Status: Married    Spouse Name: N/A  . Number of Children: 2  . Years  of Education: N/A   Occupational History  . event server    Social History Main Topics  . Smoking status: Former Smoker -- 0.25 packs/day for 15 years    Types: Cigarettes    Quit date: 07/08/2013  . Smokeless tobacco: Never Used  . Alcohol Use: Yes     Comment: occasion  . Drug Use: No  . Sexual Activity: Not Asked   Other Topics Concern  . None   Social History Narrative   Lives in [place]. Works as Chief Operating Officer.      Family History:  The patient's family history includes Cervical cancer in her maternal aunt; Diabetes in her  mother; Heart disease in her mother; Kidney disease in her maternal grandmother.   ROS:   Please see the history of present illness.    ROS All other systems reviewed and are negative.   PHYSICAL EXAM:   VS:  BP 122/80 mmHg  Pulse 68  Ht 5\' 2"  (1.575 m)  Wt 143 lb (64.864 kg)  BMI 26.15 kg/m2  LMP 08/26/2015   GEN: Well nourished, well developed, in no acute distress HEENT: normal Neck: no JVD, carotid bruits, or masses Cardiac: RRR;  2/6 systolic murmurs, rubs, or gallops,no edema  Respiratory:  clear to auscultation bilaterally, normal work of breathing GI: soft, nontender, nondistended, + BS MS: no deformity or atrophy Skin: warm and dry, no rash Neuro:  Alert and Oriented x 3, Strength and sensation are intact Psych: euthymic mood, full affect  Wt Readings from Last 3 Encounters:  09/01/15 143 lb (64.864 kg)  07/14/15 22 lb (9.979 kg)  11/19/14 140 lb (63.504 kg)      Studies/Labs Reviewed:   EKG:  I have reviewed several EKGs from past years and they showed normal sinus rhythm, and negative T waves in leads 3, aVF wheeze 3 and V4, these are not present on all of the EKGs with variably on and off during the time.  Labs: 08/26/2015: BUN 17; Creatinine, Ser 0.62; Hemoglobin 12.1; Platelets 351; Potassium 3.8; Sodium 138   Lipid Panel No results found for: CHOL, TRIG, HDL, CHOLHDL, VLDL, LDLCALC, LDLDIRECT  Additional studies/ records that were reviewed today include:  Echocardiogram, multiple EKGs, cardiac MRI in chest CT as stated in history of present illness.    ASSESSMENT:    1. Syncope, unspecified syncope type   2. Palpitations   3. Precordial pain   4. SVT (supraventricular tachycardia) (HCC)      PLAN:  In order of problems listed above:  1. We will recheck patient's TSH today, we will reevaluate the echocardiogram. We will also start 30 day event monitor for evaluation of her arrhythmias.  Follow-up in 6 weeks.  Medication Adjustments/Labs and  Tests Ordered: Current medicines are reviewed at length with the patient today.  Concerns regarding medicines are outlined above.  Medication changes, Labs and Tests ordered today are listed in the Patient Instructions below. Patient Instructions  Your physician recommends that you continue on your current medications as directed. Please refer to the Current Medication list given to you today. Your physician recommends that you return for lab work today [facility])  Your physician has requested that you have an echocardiogram. Echocardiography is a painless test that uses sound waves to create images of your heart. It provides your doctor with information about the size and shape of your heart and how well your heart's chambers and valves are working. This procedure takes approximately one hour. There are no restrictions for this  procedure.  Your physician has recommended that you wear a holter monitor. Holter monitors are medical devices that record the heart's electrical activity. Doctors most often use these monitors to diagnose arrhythmias. Arrhythmias are problems with the speed or rhythm of the heartbeat. The monitor is a small, portable device. You can wear one while you do your normal daily activities. This is usually used to diagnose what is causing palpitations/syncope (passing out).  Your physician recommends that you schedule a follow-up appointment in: 2 months with Dr. Meda Coffee.      Signed, Ena Dawley, MD  09/01/2015 3:19 PM    [address], [place], [place]  [address] Phone: [phone]; Fax: [phone]

## 2015-09-01 NOTE — Patient Instructions (Addendum)
Your physician recommends that you continue on your current medications as directed. Please refer to the Current Medication list given to you today. Your physician recommends that you return for lab work today [facility])  Your physician has requested that you have an echocardiogram. Echocardiography is a painless test that uses sound waves to create images of your heart. It provides your doctor with information about the size and shape of your heart and how well your heart's chambers and valves are working. This procedure takes approximately one hour. There are no restrictions for this procedure.  Your physician has recommended that you wear a holter monitor. Holter monitors are medical devices that record the heart's electrical activity. Doctors most often use these monitors to diagnose arrhythmias. Arrhythmias are problems with the speed or rhythm of the heartbeat. The monitor is a small, portable device. You can wear one while you do your normal daily activities. This is usually used to diagnose what is causing palpitations/syncope (passing out).  Your physician recommends that you schedule a follow-up appointment in: 2 months with Dr. Meda Coffee.

## 2015-09-17 ENCOUNTER — Ambulatory Visit (INDEPENDENT_AMBULATORY_CARE_PROVIDER_SITE_OTHER): Payer: BLUE CROSS/BLUE SHIELD

## 2015-09-17 ENCOUNTER — Ambulatory Visit (HOSPITAL_COMMUNITY): Payer: BLUE CROSS/BLUE SHIELD | Attending: Cardiovascular Disease

## 2015-09-17 ENCOUNTER — Other Ambulatory Visit: Payer: Self-pay

## 2015-09-17 DIAGNOSIS — Z8249 Family history of ischemic heart disease and other diseases of the circulatory system: Secondary | ICD-10-CM | POA: Insufficient documentation

## 2015-09-17 DIAGNOSIS — J45909 Unspecified asthma, uncomplicated: Secondary | ICD-10-CM | POA: Diagnosis not present

## 2015-09-17 DIAGNOSIS — R55 Syncope and collapse: Secondary | ICD-10-CM | POA: Diagnosis not present

## 2015-09-17 DIAGNOSIS — R072 Precordial pain: Secondary | ICD-10-CM | POA: Diagnosis not present

## 2015-09-17 DIAGNOSIS — Z87891 Personal history of nicotine dependence: Secondary | ICD-10-CM | POA: Diagnosis not present

## 2015-09-17 DIAGNOSIS — I471 Supraventricular tachycardia: Secondary | ICD-10-CM

## 2015-09-17 DIAGNOSIS — R002 Palpitations: Secondary | ICD-10-CM | POA: Diagnosis not present

## 2015-09-18 ENCOUNTER — Telehealth: Payer: Self-pay | Admitting: *Deleted

## 2015-09-18 MED ORDER — DILTIAZEM HCL ER COATED BEADS 120 MG PO CP24: 120.0000 mg | ORAL_CAPSULE | Freq: Every day | ORAL | Status: DC

## 2015-09-18 NOTE — Telephone Encounter (Signed)
Spoke with pt, Follow up scheduled with dr allred.

## 2015-09-18 NOTE — Addendum Note (Signed)
Addended by: Cristopher Estimable on: 09/18/2015 11:11 AM   Modules accepted: Orders

## 2015-09-18 NOTE — Telephone Encounter (Signed)
Discussed with dr allred, pt to start diltiazem cd 120 mg once daily. Will make dr Meda Coffee aware and to consider EP referral. Left message for pt to call

## 2015-09-18 NOTE — Telephone Encounter (Signed)
Please schedule an appointment with EP (preferably Dr Rayann Heman)

## 2015-09-18 NOTE — Telephone Encounter (Signed)
Spoke with pt, New script sent to the pharmacy.  Valsalva maneuvers discussed with pt.

## 2015-09-18 NOTE — Telephone Encounter (Signed)
Alert from monitor shows SVT with rate 188. She has cp and sob when she has this fast heart rate. The strips are from last night but the pt reports she just had another episode about 15 to 20 min ago. She reports she can have 2 to 3 episodes in one day. Will discuss with dr Rayann Heman (DOD).

## 2015-09-23 ENCOUNTER — Telehealth: Payer: Self-pay | Admitting: Cardiology

## 2015-09-23 NOTE — Telephone Encounter (Signed)
Will route this information to [facility] in Medical Records, for she has the pts work/fmla forms that Dr Meda Coffee advised on yesterday.  Maudie Mercury will follow-up with the appropriate HR representative at the pts place of work.

## 2015-09-23 NOTE — Telephone Encounter (Signed)
Spoke with Madison Copeland HR representative for [employer] she did confirm there is not a Sports administrator I cannot fax these forms over- I called patient she is aware forms cannot be faxed she will come  And pick up today and deliver to her job.

## 2015-09-23 NOTE — Telephone Encounter (Signed)
New message      FYI Calling to let the nurse know the HR person at [employer] does not have a "secure" fax for you to fax requested info.  She will consult Ms Schlotzhauer to see if she want to come by and pick up info or if there is another fax patient want you to fax the info

## 2015-09-24 ENCOUNTER — Telehealth: Payer: Self-pay | Admitting: Cardiology

## 2015-09-24 NOTE — Telephone Encounter (Signed)
Madison Copeland paperwork picked Up.

## 2015-09-30 ENCOUNTER — Encounter: Payer: Self-pay | Admitting: *Deleted

## 2015-09-30 ENCOUNTER — Encounter: Payer: Self-pay | Admitting: Internal Medicine

## 2015-09-30 ENCOUNTER — Ambulatory Visit (INDEPENDENT_AMBULATORY_CARE_PROVIDER_SITE_OTHER): Payer: BLUE CROSS/BLUE SHIELD | Admitting: Internal Medicine

## 2015-09-30 ENCOUNTER — Other Ambulatory Visit: Payer: Self-pay

## 2015-09-30 VITALS — BP 123/82 | HR 62 | Ht 62.0 in | Wt 141.4 lb

## 2015-09-30 DIAGNOSIS — I471 Supraventricular tachycardia, unspecified: Secondary | ICD-10-CM | POA: Insufficient documentation

## 2015-09-30 DIAGNOSIS — R55 Syncope and collapse: Secondary | ICD-10-CM | POA: Diagnosis not present

## 2015-09-30 NOTE — Patient Instructions (Signed)
Medication Instructions:  Your physician recommends that you continue on your current medications as directed. Please refer to the Current Medication list given to you today.   Labwork: Your physician recommends that you return for lab work on 10/20/15    Testing/Procedures: Your physician has recommended that you have an ablation. Catheter ablation is a medical procedure used to treat some cardiac arrhythmias (irregular heartbeats). During catheter ablation, a long, thin, flexible tube is put into a blood vessel in your groin (upper thigh), or neck. This tube is called an ablation catheter. It is then guided to your heart through the blood vessel. Radio frequency waves destroy small areas of heart tissue where abnormal heartbeats may cause an arrhythmia to start. Please see the instruction sheet given to you today.--10/27/15   Please arrive at [facility] on 10/27/15 at 7:30am Do not eat or drink after midnight the night before procedure Do not take any medications the morning of the procedure Hold Diltiazem 48 hours prior to procedure Plan for one night stay    Follow-Up:  Your physician recommends that you schedule a follow-up appointment in: 4 weeks from 10/27/15 with Dr Rayann Heman   Any Other Special Instructions Will Be Listed Below (If Applicable).     If you need a refill on your cardiac medications before your next appointment, please call your pharmacy.

## 2015-09-30 NOTE — Progress Notes (Signed)
Electrophysiology Office Note   Date:  09/30/2015   ID:  Madison Copeland, DOB 17-Apr-1985, MRN [id number]  PCP:  Scheduled to establish soon Cardiologist:  Dr Meda Coffee Primary Electrophysiologist: Thompson Grayer, MD    Chief Complaint  Patient presents with  . SVT     History of Present Illness: Madison Copeland is a 30 y.o. female who presents today for electrophysiology evaluation.   The patient reports abrupt onset of tachypalpitations at work 01/2015. She reports associated SOB and dizziness.  She felt that she may pass out.  This resolved in transport by EMS prior to requiring adenosine.  She has since noticed increasing frequency and duration of episodes.  She is unaware of triggers/ precipitants.  It is worsened by anxiety.  Vagal maneuvers have not helped.  She has had that carotid massage by a friend has helped.  Episodes sometimes gradually stop but also abruptly stop at other times.  Episodes typically last few minutes to 30 minutes and may at times recurrent frequently throughout the day. Through childhood, she had syncopal spells.  Workup has previously been unrevealing.  These events occur with standing too quickly.  She has had a syncope spell in the setting of SVT several months ago.  She has worn an event monitor which documented SVT at 160 bpm.  She was started on diltiazem CD 120mg  daily.  She has increased the medicine to 240mg  daily feels that episodes have improved.  She continues to have episodes.  She reports associated lightheadedness and nausea.  Today, she denies symptoms of palpitations, chest pain, shortness of breath, orthopnea, PND, lower extremity edema, claudication, dizziness, presyncope, syncope, bleeding, or neurologic sequela. The patient is tolerating medications without difficulties and is otherwise without complaint today.    Past Medical History  Diagnosis Date  . Asthma   . Migraine   . Fibroid, uterine     small intramural  . Tobacco abuse     quit  smoking 2015  . Anemia     during pregnancy  . Ovarian cyst rupture 07/22/13  . Cryptogenic organizing pneumonia ([place]) 07/22/13   Past Surgical History  Procedure Laterality Date  . Cesarean section      x 2  . Tubal ligation       Current Outpatient Prescriptions  Medication Sig Dispense Refill  . acetaminophen-codeine (TYLENOL #3) 300-30 MG tablet Take 1 tablet by mouth as directed.    Marland Kitchen amoxicillin (AMOXIL) 500 MG capsule Take 500 mg by mouth. When going to the dentist    . diltiazem (CARDIZEM CD) 120 MG 24 hr capsule Take 1 capsule (120 mg total) by mouth daily. 30 capsule 12  . ibuprofen (ADVIL,MOTRIN) 200 MG tablet Take 800 mg by mouth every 6 (six) hours as needed for mild pain or moderate pain.     . meclizine (ANTIVERT) 25 MG tablet Take 1 tablet (25 mg total) by mouth 3 (three) times daily as needed for dizziness. 30 tablet 0  . promethazine (PHENERGAN) 25 MG tablet Take 1 tablet (25 mg total) by mouth every 6 (six) hours as needed for nausea or vomiting. 30 tablet 0   No current facility-administered medications for this visit.    Allergies:   Zofran and Adhesive   Social History:  The patient  reports that she quit smoking about 2 years ago. Her smoking use included Cigarettes. She has a 3.75 pack-year smoking history. She has never used smokeless tobacco. She reports that she drinks alcohol. She reports that she  does not use illicit drugs.   Family History:  The patient's  family history includes Cervical cancer in her maternal aunt; Diabetes in her maternal grandmother; Heart disease in her mother; Kidney disease in her maternal grandmother.    ROS:  Please see the history of present illness.   All other systems are reviewed and negative.    PHYSICAL EXAM: VS:  BP 123/82 mmHg  Pulse 62  Ht 5\' 2"  (1.575 m)  Wt 141 lb 6.4 oz (64.139 kg)  BMI 25.86 kg/m2 , BMI Body mass index is 25.86 kg/(m^2). GEN: Well nourished, well developed, in no acute distress HEENT:  normal Neck: no JVD, carotid bruits, or masses Cardiac: RRR; no murmurs, rubs, or gallops,no edema  Respiratory:  clear to auscultation bilaterally, normal work of breathing GI: soft, nontender, nondistended, + BS MS: no deformity or atrophy Skin: warm and dry  Neuro:  Strength and sensation are intact Psych: euthymic mood, full affect  EKG:  EKGs in epic are reviewed Event monitor is reviewed though she is still wearing this so I do not have a final report   Recent Labs: 08/26/2015: BUN 17; Creatinine, Ser 0.62; Hemoglobin 12.1; Platelets 351; Potassium 3.8; Sodium 138 09/01/2015: TSH 0.93    Lipid Panel  No results found for: CHOL, TRIG, HDL, CHOLHDL, VLDL, LDLCALC, LDLDIRECT   Wt Readings from Last 3 Encounters:  09/30/15 141 lb 6.4 oz (64.139 kg)  09/01/15 143 lb (64.864 kg)  07/14/15 22 lb (9.979 kg)      Other studies Reviewed: Additional studies/ records that were reviewed today include: echo, Dr Thera Flake notes, ekgs, event monitor  Review of the above records today demonstrates: preserved EF   ASSESSMENT AND PLAN:  1.  SVT The patient has symptomatic recurrent SVT.  She has failed medical therapy with diltiazem.  Episodes have terminated prior to requiring adenosine. Therapeutic strategies for supraventricular tachycardia including medicine and ablation were discussed in detail with the patient today. Risk, benefits, and alternatives to EP study and radiofrequency ablation were also discussed in detail today. These risks include but are not limited to stroke, bleeding, vascular damage, tamponade, perforation, damage to the heart and other structures, AV block requiring pacemaker, worsening renal function, and death. The patient understands these risk and wishes to proceed.  We will therefore proceed with catheter ablation at the next available time.  Will request carto and anesthesia for the case.  Hold diltiazem 48 hours prior to the procedure.  2. Syncope Likely  related to SVT Will reassess with EPS.  Current medicines are reviewed at length with the patient today.   The patient does not have concerns regarding her medicines.  The following changes were made today:  none    Signed, Thompson Grayer, MD  09/30/2015 11:16 AM     [facility] [address] [place] [address] [phone] (office) [phone] (fax)

## 2015-10-12 ENCOUNTER — Institutional Professional Consult (permissible substitution): Payer: BLUE CROSS/BLUE SHIELD | Admitting: Internal Medicine

## 2015-10-20 ENCOUNTER — Other Ambulatory Visit: Payer: Self-pay | Admitting: *Deleted

## 2015-10-20 ENCOUNTER — Other Ambulatory Visit (INDEPENDENT_AMBULATORY_CARE_PROVIDER_SITE_OTHER): Payer: BLUE CROSS/BLUE SHIELD

## 2015-10-20 DIAGNOSIS — I471 Supraventricular tachycardia: Secondary | ICD-10-CM

## 2015-10-20 LAB — CBC WITH DIFFERENTIAL/PLATELET
BASOS PCT: 0 %
Basophils Absolute: 0 cells/uL (ref 0–200)
EOS PCT: 3 %
Eosinophils Absolute: 180 cells/uL (ref 15–500)
HEMATOCRIT: 34.7 % — AB (ref 35.0–45.0)
HEMOGLOBIN: 11.7 g/dL (ref 11.7–15.5)
LYMPHS ABS: 1740 {cells}/uL (ref 850–3900)
Lymphocytes Relative: 29 %
MCH: 30.7 pg (ref 27.0–33.0)
MCHC: 33.7 g/dL (ref 32.0–36.0)
MCV: 91.1 fL (ref 80.0–100.0)
MONO ABS: 300 {cells}/uL (ref 200–950)
MPV: 9.2 fL (ref 7.5–12.5)
Monocytes Relative: 5 %
NEUTROS ABS: 3780 {cells}/uL (ref 1500–7800)
Neutrophils Relative %: 63 %
Platelets: 342 10*3/uL (ref 140–400)
RBC: 3.81 MIL/uL (ref 3.80–5.10)
RDW: 13.1 % (ref 11.0–15.0)
WBC: 6 10*3/uL (ref 3.8–10.8)

## 2015-10-20 LAB — BASIC METABOLIC PANEL
BUN: 13 mg/dL (ref 7–25)
CO2: 22 mmol/L (ref 20–31)
Calcium: 9.5 mg/dL (ref 8.6–10.2)
Chloride: 107 mmol/L (ref 98–110)
Creat: 0.73 mg/dL (ref 0.50–1.10)
Glucose, Bld: 86 mg/dL (ref 65–99)
POTASSIUM: 4.6 mmol/L (ref 3.5–5.3)
SODIUM: 138 mmol/L (ref 135–146)

## 2015-10-20 NOTE — Addendum Note (Signed)
Addended by: Bobby Rumpf C on: 10/20/2015 11:05 AM   Modules accepted: Orders

## 2015-10-20 NOTE — Addendum Note (Signed)
Addended by: Bobby Rumpf C on: 10/20/2015 11:11 AM   Modules accepted: Orders

## 2015-10-21 LAB — HCG, SERUM, QUALITATIVE: PREG SERUM: NEGATIVE

## 2015-10-22 ENCOUNTER — Telehealth: Payer: Self-pay | Admitting: Internal Medicine

## 2015-10-22 ENCOUNTER — Encounter: Payer: Self-pay | Admitting: *Deleted

## 2015-10-22 NOTE — Telephone Encounter (Signed)
Letter placed at [facility] [employer] [employer] for pick up

## 2015-10-22 NOTE — Telephone Encounter (Signed)
Patient aware letter ready for pick up.  

## 2015-10-22 NOTE — Telephone Encounter (Signed)
Walk in pt form-pt needs note concerning procedure-gave to [facility] Letter generated for patient-called patient 10:56 am could not leave message mailbox full.

## 2015-10-27 ENCOUNTER — Encounter (HOSPITAL_COMMUNITY): Payer: Self-pay | Admitting: Anesthesiology

## 2015-10-27 ENCOUNTER — Ambulatory Visit (HOSPITAL_COMMUNITY): Payer: BLUE CROSS/BLUE SHIELD | Admitting: Anesthesiology

## 2015-10-27 ENCOUNTER — Encounter (HOSPITAL_COMMUNITY)
Admission: RE | Disposition: A | Discharge status: Home / Self Care | Payer: Self-pay | Source: Ambulatory Visit | Attending: Internal Medicine

## 2015-10-27 ENCOUNTER — Ambulatory Visit (HOSPITAL_COMMUNITY)
Admission: RE | Admit: 2015-10-27 | Discharge: 2015-10-27 | Disposition: A | Discharge status: Home / Self Care | Payer: BLUE CROSS/BLUE SHIELD | Source: Ambulatory Visit | Attending: Internal Medicine | Admitting: Internal Medicine

## 2015-10-27 DIAGNOSIS — J45909 Unspecified asthma, uncomplicated: Secondary | ICD-10-CM | POA: Diagnosis not present

## 2015-10-27 DIAGNOSIS — I471 Supraventricular tachycardia, unspecified: Secondary | ICD-10-CM | POA: Diagnosis present

## 2015-10-27 DIAGNOSIS — Z87891 Personal history of nicotine dependence: Secondary | ICD-10-CM | POA: Insufficient documentation

## 2015-10-27 DIAGNOSIS — G43909 Migraine, unspecified, not intractable, without status migrainosus: Secondary | ICD-10-CM | POA: Insufficient documentation

## 2015-10-27 DIAGNOSIS — Z8701 Personal history of pneumonia (recurrent): Secondary | ICD-10-CM | POA: Insufficient documentation

## 2015-10-27 DIAGNOSIS — D259 Leiomyoma of uterus, unspecified: Secondary | ICD-10-CM | POA: Diagnosis not present

## 2015-10-27 DIAGNOSIS — F419 Anxiety disorder, unspecified: Secondary | ICD-10-CM | POA: Diagnosis not present

## 2015-10-27 DIAGNOSIS — R55 Syncope and collapse: Secondary | ICD-10-CM | POA: Insufficient documentation

## 2015-10-27 DIAGNOSIS — Z833 Family history of diabetes mellitus: Secondary | ICD-10-CM | POA: Diagnosis not present

## 2015-10-27 HISTORY — PX: ELECTROPHYSIOLOGIC STUDY: SHX172A

## 2015-10-27 LAB — PREGNANCY, URINE: Preg Test, Ur: NEGATIVE

## 2015-10-27 SURGERY — A-FLUTTER/A-TACH/SVT ABLATION
Anesthesia: Monitor Anesthesia Care

## 2015-10-27 MED ORDER — SODIUM CHLORIDE 0.9 % IV SOLN
INTRAVENOUS | Status: DC
  Administered 2015-10-27 (×2): via INTRAVENOUS

## 2015-10-27 MED ORDER — PROPOFOL 500 MG/50ML IV EMUL
INTRAVENOUS | Status: DC | PRN
  Administered 2015-10-27: 100 ug/kg/min via INTRAVENOUS

## 2015-10-27 MED ORDER — SODIUM CHLORIDE 0.9% FLUSH: 3.0000 mL | Freq: Two times a day (BID) | INTRAVENOUS | Status: DC

## 2015-10-27 MED ORDER — HYDROCODONE-ACETAMINOPHEN 5-325 MG PO TABS
ORAL_TABLET | ORAL | Status: DC
Start: 2015-10-27 — End: 2015-10-27
  Filled 2015-10-27: qty 2

## 2015-10-27 MED ORDER — ISOPROTERENOL HCL 0.2 MG/ML IJ SOLN
INTRAVENOUS | Status: DC | PRN
  Administered 2015-10-27: 2 ug/min via INTRAVENOUS

## 2015-10-27 MED ORDER — ACETAMINOPHEN 325 MG PO TABS
ORAL_TABLET | ORAL | Status: AC
  Filled 2015-10-27: qty 2

## 2015-10-27 MED ORDER — PROMETHAZINE HCL 12.5 MG PO TABS
12.5000 mg | ORAL_TABLET | Freq: Once | ORAL | Status: AC
  Administered 2015-10-27: 12.5 mg via ORAL
  Filled 2015-10-27: qty 1

## 2015-10-27 MED ORDER — MIDAZOLAM HCL 5 MG/5ML IJ SOLN
INTRAMUSCULAR | Status: DC | PRN
  Administered 2015-10-27: 2 mg via INTRAVENOUS

## 2015-10-27 MED ORDER — HYDROCODONE-ACETAMINOPHEN 5-325 MG PO TABS
1.0000 | ORAL_TABLET | ORAL | Status: DC | PRN
  Administered 2015-10-27: 2 via ORAL

## 2015-10-27 MED ORDER — HYDROCODONE-ACETAMINOPHEN 5-325 MG PO TABS
ORAL_TABLET | ORAL | Status: AC
  Filled 2015-10-27: qty 2

## 2015-10-27 MED ORDER — FENTANYL CITRATE (PF) 100 MCG/2ML IJ SOLN
INTRAMUSCULAR | Status: DC | PRN
  Administered 2015-10-27 (×2): 50 ug via INTRAVENOUS

## 2015-10-27 MED ORDER — PROPOFOL 10 MG/ML IV BOLUS
INTRAVENOUS | Status: DC | PRN
  Administered 2015-10-27: 70 mg via INTRAVENOUS
  Administered 2015-10-27: 20 mg via INTRAVENOUS

## 2015-10-27 MED ORDER — LIDOCAINE HCL (CARDIAC) 20 MG/ML IV SOLN
INTRAVENOUS | Status: DC | PRN
  Administered 2015-10-27: 100 mg via INTRATRACHEAL

## 2015-10-27 MED ORDER — SODIUM CHLORIDE 0.9 % IV SOLN: 250.0000 mL | INTRAVENOUS | Status: DC | PRN

## 2015-10-27 MED ORDER — BUPIVACAINE HCL (PF) 0.25 % IJ SOLN
INTRAMUSCULAR | Status: DC | PRN
  Administered 2015-10-27: 28 mL

## 2015-10-27 MED ORDER — SODIUM CHLORIDE 0.9% FLUSH: 3.0000 mL | INTRAVENOUS | Status: DC | PRN

## 2015-10-27 MED ORDER — SODIUM CHLORIDE 0.9 % IV SOLN
2.0000 ug/min | INTRAVENOUS | Status: DC
  Filled 2015-10-27: qty 2

## 2015-10-27 MED ORDER — ACETAMINOPHEN 325 MG PO TABS
650.0000 mg | ORAL_TABLET | Freq: Once | ORAL | Status: AC
  Administered 2015-10-27: 650 mg via ORAL

## 2015-10-27 MED ORDER — PHENYLEPHRINE HCL 10 MG/ML IJ SOLN
INTRAMUSCULAR | Status: DC | PRN
  Administered 2015-10-27: 80 ug via INTRAVENOUS

## 2015-10-27 MED ORDER — BUPIVACAINE HCL (PF) 0.25 % IJ SOLN
INTRAMUSCULAR | Status: AC
  Filled 2015-10-27: qty 30

## 2015-10-27 SURGICAL SUPPLY — 11 items
BAG SNAP BAND KOVER 36X36 (MISCELLANEOUS) ×3 IMPLANT
BLANKET WARM UNDERBOD FULL ACC (MISCELLANEOUS) ×3 IMPLANT
CATH HEXAPOLAR DAMATO 6F (CATHETERS) ×3 IMPLANT
CATH JOSEPH QUAD ALLRED 6F REP (CATHETERS) ×6 IMPLANT
PACK EP LATEX FREE (CUSTOM PROCEDURE TRAY) ×2
PACK EP LF (CUSTOM PROCEDURE TRAY) ×1 IMPLANT
PAD DEFIB LIFELINK (PAD) ×3 IMPLANT
PATCH CARTO3 (PAD) ×3 IMPLANT
SHEATH PINNACLE 6F 10CM (SHEATH) ×9 IMPLANT
SHEATH PINNACLE 8F 10CM (SHEATH) ×3 IMPLANT
SHIELD RADPAD SCOOP 12X17 (MISCELLANEOUS) ×3 IMPLANT

## 2015-10-27 NOTE — Discharge Instructions (Signed)
CALL DR ALLRED IF ANY PROBLEMS,QUESTIONS, OR CONCERNS; CALL IF ANY BLEEDING, DRAINAGE, FEVER, PAIN, SWELLING, OR REDNESS AT RIGHT NECK SITE OR AT RIGHT GROIN SITE

## 2015-10-27 NOTE — Interval H&P Note (Signed)
History and Physical Interval Note:  10/27/2015 11:35 AM  Madison Copeland  has presented today for surgery, with the diagnosis of svt  The various methods of treatment have been discussed with the patient and family. After consideration of risks, benefits and other options for treatment, the patient has consented to  Procedure(s): SVT Ablation (N/A) as a surgical intervention .  The patient's history has been reviewed, patient examined, no change in status, stable for surgery.  I have reviewed the patient's chart and labs.  Questions were answered to the patient's satisfaction.     Thompson Grayer

## 2015-10-27 NOTE — Anesthesia Postprocedure Evaluation (Signed)
Anesthesia Post Note  Patient: Madison Copeland  Procedure(s) Performed: Procedure(s) (LRB): SVT Ablation (N/A)  Patient location during evaluation: PACU Anesthesia Type: MAC Level of consciousness: awake and alert Pain management: pain level controlled Vital Signs Assessment: post-procedure vital signs reviewed and stable Respiratory status: spontaneous breathing, nonlabored ventilation, respiratory function stable and patient connected to nasal cannula oxygen Cardiovascular status: stable and blood pressure returned to baseline Anesthetic complications: no    Last Vitals:  Vitals:   10/27/15 1359 10/27/15 1504  BP:  119/77  Pulse:  74  Resp:    Temp: 36.6 C 36.9 C    Last Pain:  Vitals:   10/27/15 1504  TempSrc: Oral  PainSc:                  Effie Berkshire

## 2015-10-27 NOTE — Anesthesia Preprocedure Evaluation (Addendum)
Anesthesia Evaluation  Patient identified by MRN, date of birth, ID band Patient awake    Reviewed: Allergy & Precautions, H&P , NPO status , Patient's Chart, lab work & pertinent test results  Airway Mallampati: II  TM Distance: >3 FB Neck ROM: Full    Dental no notable dental hx. (+) Teeth Intact, Dental Advisory Given   Pulmonary asthma , former smoker,    Pulmonary exam normal breath sounds clear to auscultation       Cardiovascular Supra Ventricular Tachycardia  Rhythm:Regular Rate:Normal     Neuro/Psych  Headaches, negative psych ROS   GI/Hepatic negative GI ROS, Neg liver ROS,   Endo/Other  negative endocrine ROS  Renal/GU negative Renal ROS  negative genitourinary   Musculoskeletal   Abdominal   Peds  Hematology negative hematology ROS (+) anemia ,   Anesthesia Other Findings   Reproductive/Obstetrics negative OB ROS                            Anesthesia Physical Anesthesia Plan  ASA: II  Anesthesia Plan: MAC   Post-op Pain Management:    Induction: Intravenous  Airway Management Planned: Simple Face Mask  Additional Equipment:   Intra-op Plan:   Post-operative Plan:   Informed Consent: I have reviewed the patients History and Physical, chart, labs and discussed the procedure including the risks, benefits and alternatives for the proposed anesthesia with the patient or authorized representative who has indicated his/her understanding and acceptance.   Dental advisory given  Plan Discussed with: CRNA  Anesthesia Plan Comments:         Anesthesia Quick Evaluation

## 2015-10-27 NOTE — H&P (View-Only) (Signed)
Electrophysiology Office Note   Date:  09/30/2015   ID:  Madison Copeland, DOB [birth year], MRN [id number]  PCP:  Scheduled to establish soon Cardiologist:  Dr Meda Coffee Primary Electrophysiologist: Thompson Grayer, MD    Chief Complaint  Patient presents with  . SVT     History of Present Illness: Madison Copeland is a 30 y.o. female who presents today for electrophysiology evaluation.   The patient reports abrupt onset of tachypalpitations at work 01/2015. She reports associated SOB and dizziness.  She felt that she may pass out.  This resolved in transport by EMS prior to requiring adenosine.  She has since noticed increasing frequency and duration of episodes.  She is unaware of triggers/ precipitants.  It is worsened by anxiety.  Vagal maneuvers have not helped.  She has had that carotid massage by a friend has helped.  Episodes sometimes gradually stop but also abruptly stop at other times.  Episodes typically last few minutes to 30 minutes and may at times recurrent frequently throughout the day. Through childhood, she had syncopal spells.  Workup has previously been unrevealing.  These events occur with standing too quickly.  She has had a syncope spell in the setting of SVT several months ago.  She has worn an event monitor which documented SVT at 160 bpm.  She was started on diltiazem CD 120mg  daily.  She has increased the medicine to 240mg  daily feels that episodes have improved.  She continues to have episodes.  She reports associated lightheadedness and nausea.  Today, she denies symptoms of palpitations, chest pain, shortness of breath, orthopnea, PND, lower extremity edema, claudication, dizziness, presyncope, syncope, bleeding, or neurologic sequela. The patient is tolerating medications without difficulties and is otherwise without complaint today.    Past Medical History  Diagnosis Date  . Asthma   . Migraine   . Fibroid, uterine     small intramural  . Tobacco abuse     quit  smoking 2015  . Anemia     during pregnancy  . Ovarian cyst rupture 07/22/13  . 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 Cesarean section      x 2  . Tubal ligation       Current Outpatient Prescriptions  Medication Sig Dispense Refill  . acetaminophen-codeine (TYLENOL #3) 300-30 MG tablet Take 1 tablet by mouth as directed.    Marland Kitchen amoxicillin (AMOXIL) 500 MG capsule Take 500 mg by mouth. When going to the dentist    . diltiazem (CARDIZEM CD) 120 MG 24 hr capsule Take 1 capsule (120 mg total) by mouth daily. 30 capsule 12  . ibuprofen (ADVIL,MOTRIN) 200 MG tablet Take 800 mg by mouth every 6 (six) hours as needed for mild pain or moderate pain.     . meclizine (ANTIVERT) 25 MG tablet Take 1 tablet (25 mg total) by mouth 3 (three) times daily as needed for dizziness. 30 tablet 0  . promethazine (PHENERGAN) 25 MG tablet Take 1 tablet (25 mg total) by mouth every 6 (six) hours as needed for nausea or vomiting. 30 tablet 0   No current facility-administered medications for this visit.    Allergies:   Zofran and Adhesive   Social History:  The patient  reports that she quit smoking about 2 years ago. Her smoking use included Cigarettes. She has a 3.75 pack-year smoking history. She has never used smokeless tobacco. She reports that she drinks alcohol. She reports that she  does not use illicit drugs.   Family History:  The patient's  family history includes Cervical cancer in her maternal aunt; Diabetes in her maternal grandmother; Heart disease in her mother; Kidney disease in her maternal grandmother.    ROS:  Please see the history of present illness.   All other systems are reviewed and negative.    PHYSICAL EXAM: VS:  BP 123/82 mmHg  Pulse 62  Ht 5\' 2"  (1.575 m)  Wt 141 lb 6.4 oz (64.139 kg)  BMI 25.86 kg/m2 , BMI Body mass index is 25.86 kg/(m^2). GEN: Well nourished, well developed, in no acute distress HEENT:  normal Neck: no JVD, carotid bruits, or masses Cardiac: RRR; no murmurs, rubs, or gallops,no edema  Respiratory:  clear to auscultation bilaterally, normal work of breathing GI: soft, nontender, nondistended, + BS MS: no deformity or atrophy Skin: warm and dry  Neuro:  Strength and sensation are intact Psych: euthymic mood, full affect  EKG:  EKGs in epic are reviewed Event monitor is reviewed though she is still wearing this so I do not have a final report   Recent Labs: 08/26/2015: BUN 17; Creatinine, Ser 0.62; Hemoglobin 12.1; Platelets 351; Potassium 3.8; Sodium 138 09/01/2015: TSH 0.93    Lipid Panel  No results found for: CHOL, TRIG, HDL, CHOLHDL, VLDL, LDLCALC, LDLDIRECT   Wt Readings from Last 3 Encounters:  09/30/15 141 lb 6.4 oz (64.139 kg)  09/01/15 143 lb (64.864 kg)  07/14/15 22 lb (9.979 kg)      Other studies Reviewed: Additional studies/ records that were reviewed today include: echo, Dr Thera Flake notes, ekgs, event monitor  Review of the above records today demonstrates: preserved EF   ASSESSMENT AND PLAN:  1.  SVT The patient has symptomatic recurrent SVT.  She has failed medical therapy with diltiazem.  Episodes have terminated prior to requiring adenosine. Therapeutic strategies for supraventricular tachycardia including medicine and ablation were discussed in detail with the patient today. Risk, benefits, and alternatives to EP study and radiofrequency ablation were also discussed in detail today. These risks include but are not limited to stroke, bleeding, vascular damage, tamponade, perforation, damage to the heart and other structures, AV block requiring pacemaker, worsening renal function, and death. The patient understands these risk and wishes to proceed.  We will therefore proceed with catheter ablation at the next available time.  Will request carto and anesthesia for the case.  Hold diltiazem 48 hours prior to the procedure.  2. Syncope Likely  related to SVT Will reassess with EPS.  Current medicines are reviewed at length with the patient today.   The patient does not have concerns regarding her medicines.  The following changes were made today:  none    Signed, Thompson Grayer, MD  09/30/2015 11:16 AM     [facility] [address] [place] [address] [phone] (office) [phone] (fax)

## 2015-10-27 NOTE — Transfer of Care (Signed)
Immediate Anesthesia Transfer of Care Note  Patient: Madison Copeland  Procedure(s) Performed: Procedure(s): SVT Ablation (N/A)  Patient Location: PACU  Anesthesia Type:MAC  Level of Consciousness: awake, oriented and patient cooperative  Airway & Oxygen Therapy: Patient Spontanous Breathing and Patient connected to nasal cannula oxygen  Post-op Assessment: Report given to RN and Post -op Vital signs reviewed and stable  Post vital signs: Reviewed  Last Vitals:  Vitals:   10/27/15 1040  BP: 114/69  Pulse: (!) 55  Resp: 16  Temp: 37.1 C    Last Pain:  Vitals:   10/27/15 1040  TempSrc: Oral         Complications: No apparent anesthesia complications

## 2015-10-27 NOTE — Anesthesia Procedure Notes (Signed)
Procedure Name: MAC Date/Time: 10/27/2015 12:10 PM Performed by: Jenne Campus Pre-anesthesia Checklist: Patient identified, Emergency Drugs available, Patient being monitored, Timeout performed and Suction available Patient Re-evaluated:Patient Re-evaluated prior to inductionOxygen Delivery Method: Simple face mask Intubation Type: IV induction

## 2015-10-27 NOTE — Anesthesia Procedure Notes (Deleted)
Procedure Name: MAC Date/Time: 10/27/2015 12:10 PM Performed by: Jenne Campus Pre-anesthesia Checklist: Patient identified, Emergency Drugs available, Suction available, Patient being monitored and Timeout performed Oxygen Delivery Method: Simple face mask

## 2015-10-28 ENCOUNTER — Encounter (HOSPITAL_COMMUNITY): Payer: Self-pay | Admitting: Internal Medicine

## 2015-11-17 ENCOUNTER — Encounter: Payer: Self-pay | Admitting: Internal Medicine

## 2015-11-19 ENCOUNTER — Encounter: Payer: Self-pay | Admitting: Cardiology

## 2015-11-30 ENCOUNTER — Ambulatory Visit (INDEPENDENT_AMBULATORY_CARE_PROVIDER_SITE_OTHER): Payer: BLUE CROSS/BLUE SHIELD | Admitting: Internal Medicine

## 2015-11-30 ENCOUNTER — Encounter: Payer: Self-pay | Admitting: Internal Medicine

## 2015-11-30 VITALS — BP 106/74 | HR 63 | Ht 62.0 in | Wt 137.0 lb

## 2015-11-30 DIAGNOSIS — R002 Palpitations: Secondary | ICD-10-CM

## 2015-11-30 DIAGNOSIS — R000 Tachycardia, unspecified: Secondary | ICD-10-CM | POA: Diagnosis not present

## 2015-11-30 NOTE — Progress Notes (Signed)
Electrophysiology Office Note   Date:  11/30/2015   ID:  Diavian Vanderjagt, DOB August 02, 1985, MRN [id number]  PCP:  Scheduled to establish soon Cardiologist:  Dr Meda Coffee Primary Electrophysiologist: Thompson Grayer, MD    CC: tachycardia   History of Present Illness: Madison Copeland is a 30 y.o. female who presents today for electrophysiology evaluation.  Recent EPS revealed no inducible arrhythmias.  No accessory pathways. Currently, she is doing well with diltiazem.  No recent episodes of arrhythmia.  Denies any further presyncope or syncope. She reports that she has had some bilateral leg weakness recently.  Denies any recent back pain or injury but has had prior back issues for which she states that she has seen a chiropractor.  Today, she denies symptoms of palpitations, chest pain, shortness of breath, orthopnea, PND, lower extremity edema, claudication, dizziness, presyncope, syncope, bleeding, or other new neurologic sequela. The patient is tolerating medications without difficulties and is otherwise without complaint today.    Past Medical History:  Diagnosis Date  . Anemia    during pregnancy  . Asthma   . 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 Fibroid, uterine    small intramural  . Migraine   . Ovarian cyst rupture 07/22/13  . Tobacco abuse    quit smoking 2015   Past Surgical History:  Procedure Laterality Date  . CESAREAN SECTION     x 2  . ELECTROPHYSIOLOGIC STUDY N/A 10/27/2015   Procedure: SVT Ablation;  Surgeon: Thompson Grayer, MD;  Location: [facility];  Service: Cardiovascular;  Laterality: N/A;  . TUBAL LIGATION       Current Outpatient Prescriptions  Medication Sig Dispense Refill  . acetaminophen-codeine (TYLENOL #3) 300-30 MG tablet Take 1 tablet by mouth as directed.    Marland Kitchen amoxicillin (AMOXIL) 500 MG capsule Take 500 mg by mouth daily.     Marland Kitchen diltiazem (CARDIZEM CD) 120 MG 24 hr capsule Take 1 capsule (120 mg total) by mouth daily. 30 capsule  12  . ibuprofen (ADVIL,MOTRIN) 200 MG tablet Take 800 mg by mouth every 6 (six) hours as needed for mild pain or moderate pain.      No current facility-administered medications for this visit.     Allergies:   Zofran [ondansetron hcl] and Adhesive [tape]   Social History:  The patient  reports that she quit smoking about 2 years ago. Her smoking use included Cigarettes. She has a 3.75 pack-year smoking history. She has never used smokeless tobacco. She reports that she drinks alcohol. She reports that she does not use drugs.   Family History:  The patient's  family history includes Cervical cancer in her maternal aunt; Diabetes in her maternal grandmother; Heart disease in her mother; Kidney disease in her maternal grandmother.    ROS:  Please see the history of present illness.   All other systems are reviewed and negative.    PHYSICAL EXAM: VS:  BP 106/74   Pulse 63   Ht 5\' 2"  (1.575 m)   Wt 137 lb (62.1 kg)   BMI 25.06 kg/m  , BMI Body mass index is 25.06 kg/m. GEN: Well nourished, well developed, in no acute distress  HEENT: normal  Neck: no JVD, carotid bruits, or masses Cardiac: RRR; no murmurs, rubs, or gallops,no edema  Respiratory:  clear to auscultation bilaterally, normal work of breathing GI: soft, nontender, nondistended, + BS MS: no deformity or atrophy , no vertebral tenderness to palpation Skin: warm and dry  Neuro:  Strength  and sensation are intact, hip flexor/ extensor/ add/abductors and knee/ankle flexors/extensor strength all 5/5.  DRTs 2+ 2+ DP/PT pulses, no R femoral hematoma/ bruits Psych: flat affect  EKG:  EKG today reveals sinus rhythm 63 bpm, PR 152 msec, nonspecific ST/T changes   Recent Labs: 09/01/2015: TSH 0.93 10/20/2015: BUN 13; Creat 0.73; Hemoglobin 11.7; Platelets 342; Potassium 4.6; Sodium 138    Lipid Panel  No results found for: CHOL, TRIG, HDL, CHOLHDL, VLDL, LDLCALC, LDLDIRECT   Wt Readings from Last 3 Encounters:  11/30/15 137  lb (62.1 kg)  10/27/15 143 lb (64.9 kg)  09/30/15 141 lb 6.4 oz (64.1 kg)     ASSESSMENT AND PLAN:  1.  tachycardia Normal EPS recently.  No ablation performed due to no inducible arrhythmias and no accessory pathways. Will continue low dose diltiazem Could consider placing another 30 day monitor if episodes return.  Currently, she seems to be doing well with reassurance.   2. Syncope Resolved No inducible arrhythmias at EPS  3. Leg weakness Exam today is normal I have reassured her that this is not related to her EP procedure Follow-up with PCP.  Consider neurology consultation if not improved  Return to see me in 2 months  Current medicines are reviewed at length with the patient today.   The patient does not have concerns regarding her medicines.  The following changes were made today:  none    Signed, Thompson Grayer, MD  11/30/2015 1:03 PM     [facility] [address] [place] [address] [phone] (office) [phone] (fax)

## 2015-11-30 NOTE — Patient Instructions (Signed)
Medication Instructions:  Your physician recommends that you continue on your current medications as directed. Please refer to the Current Medication list given to you today.   Labwork: None ordered   Testing/Procedures: None ordered   Follow-Up: Your physician recommends that you schedule a follow-up appointment in: 2 months with Dr Allred   Any Other Special Instructions Will Be Listed Below (If Applicable).     If you need a refill on your cardiac medications before your next appointment, please call your pharmacy.   

## 2015-12-03 ENCOUNTER — Ambulatory Visit: Payer: Self-pay | Admitting: Cardiology

## 2016-01-21 ENCOUNTER — Emergency Department (HOSPITAL_COMMUNITY): Payer: No Typology Code available for payment source

## 2016-01-21 ENCOUNTER — Emergency Department (HOSPITAL_COMMUNITY)
Admission: EM | Admit: 2016-01-21 | Discharge: 2016-01-21 | Disposition: A | Discharge status: Home / Self Care | Payer: No Typology Code available for payment source | Attending: Emergency Medicine | Admitting: Emergency Medicine

## 2016-01-21 ENCOUNTER — Encounter (HOSPITAL_COMMUNITY): Payer: Self-pay

## 2016-01-21 DIAGNOSIS — Y939 Activity, unspecified: Secondary | ICD-10-CM | POA: Insufficient documentation

## 2016-01-21 DIAGNOSIS — J45909 Unspecified asthma, uncomplicated: Secondary | ICD-10-CM | POA: Insufficient documentation

## 2016-01-21 DIAGNOSIS — Y92410 Unspecified street and highway as the place of occurrence of the external cause: Secondary | ICD-10-CM | POA: Insufficient documentation

## 2016-01-21 DIAGNOSIS — S61412A Laceration without foreign body of left hand, initial encounter: Secondary | ICD-10-CM | POA: Insufficient documentation

## 2016-01-21 DIAGNOSIS — R079 Chest pain, unspecified: Secondary | ICD-10-CM | POA: Diagnosis not present

## 2016-01-21 DIAGNOSIS — Z23 Encounter for immunization: Secondary | ICD-10-CM | POA: Diagnosis not present

## 2016-01-21 DIAGNOSIS — Z87891 Personal history of nicotine dependence: Secondary | ICD-10-CM | POA: Diagnosis not present

## 2016-01-21 DIAGNOSIS — S0990XA Unspecified injury of head, initial encounter: Secondary | ICD-10-CM | POA: Insufficient documentation

## 2016-01-21 DIAGNOSIS — R938 Abnormal findings on diagnostic imaging of other specified body structures: Secondary | ICD-10-CM | POA: Diagnosis not present

## 2016-01-21 DIAGNOSIS — S6992XA Unspecified injury of left wrist, hand and finger(s), initial encounter: Secondary | ICD-10-CM | POA: Diagnosis present

## 2016-01-21 DIAGNOSIS — Y999 Unspecified external cause status: Secondary | ICD-10-CM | POA: Diagnosis not present

## 2016-01-21 LAB — COMPREHENSIVE METABOLIC PANEL
ALBUMIN: 4.2 g/dL (ref 3.5–5.0)
ALK PHOS: 56 U/L (ref 38–126)
ALT: 12 U/L — AB (ref 14–54)
AST: 21 U/L (ref 15–41)
Anion gap: 7 (ref 5–15)
BILIRUBIN TOTAL: 0.7 mg/dL (ref 0.3–1.2)
BUN: 16 mg/dL (ref 6–20)
CALCIUM: 9.3 mg/dL (ref 8.9–10.3)
CO2: 22 mmol/L (ref 22–32)
CREATININE: 0.69 mg/dL (ref 0.44–1.00)
Chloride: 106 mmol/L (ref 101–111)
GFR calc Af Amer: >60 mL/min (ref >60)
GFR calc non Af Amer: >60 mL/min (ref >60)
GLUCOSE: 88 mg/dL (ref 65–99)
Potassium: 3.8 mmol/L (ref 3.5–5.1)
Sodium: 135 mmol/L (ref 135–145)
TOTAL PROTEIN: 7.3 g/dL (ref 6.5–8.1)

## 2016-01-21 LAB — URINALYSIS, ROUTINE W REFLEX MICROSCOPIC
BILIRUBIN URINE: NEGATIVE
GLUCOSE, UA: NEGATIVE mg/dL
Ketones, ur: 15 mg/dL — AB
Leukocytes, UA: NEGATIVE
Nitrite: NEGATIVE
PROTEIN: 30 mg/dL — AB
Specific Gravity, Urine: >1.046 — ABNORMAL HIGH (ref 1.005–1.030)
pH: 8 (ref 5.0–8.0)

## 2016-01-21 LAB — URINE MICROSCOPIC-ADD ON

## 2016-01-21 LAB — I-STAT CHEM 8, ED
BUN: 20 mg/dL (ref 6–20)
CALCIUM ION: 1.06 mmol/L — AB (ref 1.15–1.40)
CHLORIDE: 105 mmol/L (ref 101–111)
Creatinine, Ser: 0.7 mg/dL (ref 0.44–1.00)
Glucose, Bld: 86 mg/dL (ref 65–99)
HEMATOCRIT: 35 % — AB (ref 36.0–46.0)
Hemoglobin: 11.9 g/dL — ABNORMAL LOW (ref 12.0–15.0)
POTASSIUM: 3.9 mmol/L (ref 3.5–5.1)
SODIUM: 138 mmol/L (ref 135–145)
TCO2: 23 mmol/L (ref 0–100)

## 2016-01-21 LAB — I-STAT CG4 LACTIC ACID, ED: LACTIC ACID, VENOUS: 0.85 mmol/L (ref 0.5–1.9)

## 2016-01-21 LAB — I-STAT BETA HCG BLOOD, ED (MC, WL, AP ONLY): I-stat hCG, quantitative: <5 m[IU]/mL (ref <5)

## 2016-01-21 LAB — SAMPLE TO BLOOD BANK

## 2016-01-21 LAB — CBC
HCT: 33.5 % — ABNORMAL LOW (ref 36.0–46.0)
Hemoglobin: 11.2 g/dL — ABNORMAL LOW (ref 12.0–15.0)
MCH: 30.5 pg (ref 26.0–34.0)
MCHC: 33.4 g/dL (ref 30.0–36.0)
MCV: 91.3 fL (ref 78.0–100.0)
Platelets: 293 10*3/uL (ref 150–400)
RBC: 3.67 MIL/uL — ABNORMAL LOW (ref 3.87–5.11)
RDW: 13 % (ref 11.5–15.5)
WBC: 12.9 10*3/uL — ABNORMAL HIGH (ref 4.0–10.5)

## 2016-01-21 LAB — PROTIME-INR
INR: 0.93
Prothrombin Time: 12.4 seconds (ref 11.4–15.2)

## 2016-01-21 LAB — ETHANOL

## 2016-01-21 MED ORDER — IBUPROFEN 200 MG PO TABS: 400.0000 mg | ORAL_TABLET | Freq: Four times a day (QID) | ORAL | 0 refills | Status: AC | PRN

## 2016-01-21 MED ORDER — PROMETHAZINE HCL 25 MG/ML IJ SOLN
12.5000 mg | Freq: Once | INTRAMUSCULAR | Status: AC
  Administered 2016-01-21: 12.5 mg via INTRAVENOUS
  Filled 2016-01-21: qty 1

## 2016-01-21 MED ORDER — IOPAMIDOL (ISOVUE-300) INJECTION 61%
INTRAVENOUS | Status: AC
  Administered 2016-01-21: 100 mL
  Filled 2016-01-21: qty 100

## 2016-01-21 MED ORDER — ACETAMINOPHEN 500 MG PO TABS: 1000.0000 mg | ORAL_TABLET | Freq: Four times a day (QID) | ORAL | 0 refills | Status: DC | PRN

## 2016-01-21 MED ORDER — KETOROLAC TROMETHAMINE 15 MG/ML IJ SOLN
15.0000 mg | Freq: Once | INTRAMUSCULAR | Status: AC
  Administered 2016-01-21: 15 mg via INTRAVENOUS
  Filled 2016-01-21: qty 1

## 2016-01-21 MED ORDER — TETANUS-DIPHTH-ACELL PERTUSSIS 5-2.5-18.5 LF-MCG/0.5 IM SUSP
0.5000 mL | Freq: Once | INTRAMUSCULAR | Status: AC
  Administered 2016-01-21: 0.5 mL via INTRAMUSCULAR
  Filled 2016-01-21: qty 0.5

## 2016-01-21 MED ORDER — MORPHINE SULFATE (PF) 4 MG/ML IV SOLN
4.0000 mg | Freq: Once | INTRAVENOUS | Status: AC
  Administered 2016-01-21: 4 mg via INTRAVENOUS
  Filled 2016-01-21: qty 1

## 2016-01-21 MED ORDER — TETANUS-DIPHTHERIA TOXOIDS TD 5-2 LFU IM INJ
0.5000 mL | INJECTION | Freq: Once | INTRAMUSCULAR | Status: DC
  Filled 2016-01-21: qty 0.5
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
## 2016-01-21 NOTE — ED Notes (Signed)
Delay in lab draw nurse changing pt into gown and exray coming in next.
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
## 2016-01-21 NOTE — ED Notes (Signed)
Wound care attempted, pt started getting upset about the pain and states this RN wasn't gentile enough to clean her wound, pt then ask for a break to wound to be clean, pt oriented that MD will need the wound clean in other to fix it, pt started yelling that if we are not gentile enough she doesn't want the wound to be clean, family at the bedside, provider to be notified.

## 2016-01-22 LAB — CDS SEROLOGY

## 2016-04-11 ENCOUNTER — Ambulatory Visit: Payer: Self-pay | Attending: Family Medicine | Admitting: Family Medicine

## 2016-04-11 ENCOUNTER — Other Ambulatory Visit: Payer: Self-pay

## 2016-04-11 ENCOUNTER — Encounter: Payer: Self-pay | Admitting: Family Medicine

## 2016-04-11 VITALS — BP 104/70 | HR 61 | Temp 98.7°F | Resp 18 | Ht 62.0 in | Wt 138.6 lb

## 2016-04-11 DIAGNOSIS — M62838 Other muscle spasm: Secondary | ICD-10-CM | POA: Insufficient documentation

## 2016-04-11 DIAGNOSIS — Z87898 Personal history of other specified conditions: Secondary | ICD-10-CM

## 2016-04-11 DIAGNOSIS — Z8679 Personal history of other diseases of the circulatory system: Secondary | ICD-10-CM

## 2016-04-11 DIAGNOSIS — I471 Supraventricular tachycardia: Secondary | ICD-10-CM | POA: Insufficient documentation

## 2016-04-11 DIAGNOSIS — G43909 Migraine, unspecified, not intractable, without status migrainosus: Secondary | ICD-10-CM | POA: Insufficient documentation

## 2016-04-11 DIAGNOSIS — R002 Palpitations: Secondary | ICD-10-CM | POA: Insufficient documentation

## 2016-04-11 DIAGNOSIS — Z23 Encounter for immunization: Secondary | ICD-10-CM | POA: Insufficient documentation

## 2016-04-11 DIAGNOSIS — Z79899 Other long term (current) drug therapy: Secondary | ICD-10-CM | POA: Insufficient documentation

## 2016-04-11 DIAGNOSIS — Z8669 Personal history of other diseases of the nervous system and sense organs: Secondary | ICD-10-CM

## 2016-04-11 DIAGNOSIS — R55 Syncope and collapse: Secondary | ICD-10-CM | POA: Insufficient documentation

## 2016-04-11 DIAGNOSIS — R079 Chest pain, unspecified: Secondary | ICD-10-CM | POA: Insufficient documentation

## 2016-04-11 LAB — CBC WITH DIFFERENTIAL/PLATELET
Basophils Absolute: 0 cells/uL (ref 0–200)
Basophils Relative: 0 %
Eosinophils Absolute: 112 cells/uL (ref 15–500)
Eosinophils Relative: 2 %
HCT: 34.8 % — ABNORMAL LOW (ref 35.0–45.0)
Hemoglobin: 11.3 g/dL — ABNORMAL LOW (ref 11.7–15.5)
LYMPHS PCT: 29 %
Lymphs Abs: 1624 cells/uL (ref 850–3900)
MCH: 30.5 pg (ref 27.0–33.0)
MCHC: 32.5 g/dL (ref 32.0–36.0)
MCV: 94.1 fL (ref 80.0–100.0)
MONOS PCT: 5 %
MPV: 9.2 fL (ref 7.5–12.5)
Monocytes Absolute: 280 cells/uL (ref 200–950)
NEUTROS ABS: 3584 {cells}/uL (ref 1500–7800)
Neutrophils Relative %: 64 %
PLATELETS: 373 10*3/uL (ref 140–400)
RBC: 3.7 MIL/uL — ABNORMAL LOW (ref 3.80–5.10)
RDW: 13.1 % (ref 11.0–15.0)
WBC: 5.6 10*3/uL (ref 3.8–10.8)

## 2016-04-11 LAB — COMPREHENSIVE METABOLIC PANEL
ALT: 11 U/L (ref 6–29)
AST: 18 U/L (ref 10–30)
Albumin: 4.4 g/dL (ref 3.6–5.1)
Alkaline Phosphatase: 40 U/L (ref 33–115)
BUN: 13 mg/dL (ref 7–25)
CHLORIDE: 106 mmol/L (ref 98–110)
CO2: 23 mmol/L (ref 20–31)
CREATININE: 0.63 mg/dL (ref 0.50–1.10)
Calcium: 9.3 mg/dL (ref 8.6–10.2)
GLUCOSE: 80 mg/dL (ref 65–99)
Potassium: 4.1 mmol/L (ref 3.5–5.3)
Sodium: 138 mmol/L (ref 135–146)
TOTAL PROTEIN: 7.3 g/dL (ref 6.1–8.1)
Total Bilirubin: 0.7 mg/dL (ref 0.2–1.2)

## 2016-04-11 MED ORDER — BUTALBITAL-APAP-CAFF-COD 50-325-40-30 MG PO CAPS: 1.0000 | ORAL_CAPSULE | ORAL | 2 refills | Status: DC | PRN

## 2016-04-11 MED ORDER — BUTALBITAL-APAP-CAFF-COD 50-325-40-30 MG PO CAPS: 1.0000 | ORAL_CAPSULE | ORAL | 0 refills | Status: DC | PRN

## 2016-04-11 MED ORDER — CYCLOBENZAPRINE HCL 10 MG PO TABS: 10.0000 mg | ORAL_TABLET | Freq: Three times a day (TID) | ORAL | 0 refills | Status: DC | PRN

## 2016-04-11 NOTE — Patient Instructions (Addendum)
Recurrent Migraine Headache A migraine headache is very bad, throbbing pain on one or both sides of your head. Recurrent migraines keep coming back. Talk to your doctor about what things may bring on (trigger) your migraine headaches. Follow these instructions at home:  Only take medicines as told by your doctor.  Lie down in a dark, quiet room when you have a migraine.  Keep a journal to find out if certain things bring on migraine headaches. For example, write down:  What you eat and drink.  How much sleep you get.  Any change to your diet or medicines.  Lessen how much alcohol you drink.  Quit smoking if you smoke.  Get enough sleep.  Lessen any stress in your life.  Keep lights dim if bright lights bother you or make your migraines worse. Contact a doctor if:  Medicine does not help your migraines.  Your pain keeps coming back.  You have a fever. Get help right away if:  Your migraine becomes really bad.  You have a stiff neck.  You have trouble seeing.  Your muscles are weak, or you lose muscle control.  You lose your balance or have trouble walking.  You feel like you will pass out (faint), or you pass out.  You have really bad symptoms that are different than your first symptoms. This information is not intended to replace advice given to you by your health care provider. Make sure you discuss any questions you have with your health care provider. Document Released: 12/29/2007 Document Revised: 08/27/2015 Document Reviewed: 11/26/2012 Elsevier Interactive Patient Education  2017 [employer]  Palpitations Introduction A palpitation is the feeling that your heart:  Has an uneven (irregular) heartbeat.  Is beating faster than normal.  Is fluttering.  Is skipping a beat. This is usually not a serious problem. In some cases, you may need more medical tests. Follow these instructions at home:  Avoid:  Caffeine in coffee, tea, soft drinks, diet  pills, and energy drinks.  Chocolate.  Alcohol.  Do not use any tobacco products. These include cigarettes, chewing tobacco, and e-cigarettes. If you need help quitting, ask your doctor.  Try to reduce your stress. These things may help:  Yoga.  Meditation.  Physical activity. Swimming, jogging, and walking are good choices.  A method that helps you use your mind to control things in your body, like heartbeats (biofeedback).  Get plenty of rest and sleep.  Take over-the-counter and prescription medicines only as told by your doctor.  Keep all follow-up visits as told by your doctor. This is important. Contact a doctor if:  Your heartbeat is still fast or uneven after 24 hours.  Your palpitations occur more often. Get help right away if:  You have chest pain.  You feel short of breath.  You have a very bad headache.  You feel dizzy.  You pass out (faint). This information is not intended to replace advice given to you by your health care provider. Make sure you discuss any questions you have with your health care provider. Document Released: 12/29/2007 Document Revised: 08/27/2015 Document Reviewed: 12/04/2014  2017 Elsevier  Muscle Cramps and Spasms Muscle cramps and spasms are when muscles tighten by themselves. They usually get better within minutes. Muscle cramps are painful. They are usually stronger and last longer than muscle spasms. Muscle spasms may or may not be painful. They can last a few seconds or much longer. HOME CARE  Drink enough fluid to keep your pee (urine)  clear or pale yellow.  Massage, stretch, and relax the muscle.  Use a warm towel, heating pad, or warm shower water on tight muscles.  Place ice on the muscle if it is tender or in pain.  Put ice in a plastic bag.  Place a towel between your skin and the bag.  Leave the ice on for 15-20 minutes, 3-4 times a day.  Only take medicine as told by your doctor. GET HELP RIGHT AWAY IF:    Your cramps or spasms get worse, happen more often, or do not get better with time. MAKE SURE YOU:  Understand these instructions.  Will watch your condition.  Will get help right away if you are not doing well or get worse. This information is not intended to replace advice given to you by your health care provider. Make sure you discuss any questions you have with your health care provider. Document Released: 03/03/2008 Document Revised: 07/16/2012 Document Reviewed: 12/23/2014 Elsevier Interactive Patient Education  2017 [employer].
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
## 2016-04-11 NOTE — Progress Notes (Signed)
Patient is here for establish care   Patient has stated that she has a pain in the head that goes to the neck and ears.  Patient pain scale is a 7

## 2016-04-12 LAB — BRAIN NATRIURETIC PEPTIDE: BRAIN NATRIURETIC PEPTIDE: 8 pg/mL (ref <100)

## 2016-04-13 ENCOUNTER — Ambulatory Visit (HOSPITAL_COMMUNITY): Admission: RE | Admit: 2016-04-13 | Payer: BLUE CROSS/BLUE SHIELD | Source: Ambulatory Visit

## 2016-04-15 ENCOUNTER — Other Ambulatory Visit: Payer: Self-pay | Admitting: Family Medicine

## 2016-04-15 ENCOUNTER — Telehealth: Payer: Self-pay

## 2016-04-15 DIAGNOSIS — Z87898 Personal history of other specified conditions: Secondary | ICD-10-CM

## 2016-04-15 DIAGNOSIS — Z9889 Other specified postprocedural states: Secondary | ICD-10-CM | POA: Insufficient documentation

## 2016-04-15 NOTE — Telephone Encounter (Signed)
-----   Message from Alfonse Spruce, [place] sent at 04/15/2016  1:31 PM EST ----- -Labs results were normal. -BNP which used to determine if heart damage is present was normal. -Start increasing your intake of dietary iron. Good sources of iron include meats, dark green leafy vegetables, and iron fortified cereals. -If symptoms worsen or don't improve go to the ED. -Referral to cardiology has been made.

## 2016-04-15 NOTE — Telephone Encounter (Signed)
Patient Verify DOB  Patient was aware and understood her lab results being normal  Patient did not had further questions.

## 2016-06-03 ENCOUNTER — Telehealth: Payer: Self-pay | Admitting: *Deleted

## 2016-06-03 NOTE — Telephone Encounter (Signed)
I call this patient today to reschedule her echocardiogram,and she refused to schedule.

## 2017-10-15 ENCOUNTER — Emergency Department (HOSPITAL_COMMUNITY): Payer: Self-pay

## 2017-10-15 ENCOUNTER — Emergency Department (HOSPITAL_COMMUNITY)
Admission: EM | Admit: 2017-10-15 | Discharge: 2017-10-15 | Disposition: A | Discharge status: Home / Self Care | Payer: Self-pay | Attending: Emergency Medicine | Admitting: Emergency Medicine

## 2017-10-15 ENCOUNTER — Encounter (HOSPITAL_COMMUNITY): Payer: Self-pay | Admitting: Emergency Medicine

## 2017-10-15 DIAGNOSIS — Z79899 Other long term (current) drug therapy: Secondary | ICD-10-CM | POA: Insufficient documentation

## 2017-10-15 DIAGNOSIS — R002 Palpitations: Secondary | ICD-10-CM | POA: Insufficient documentation

## 2017-10-15 DIAGNOSIS — Z87891 Personal history of nicotine dependence: Secondary | ICD-10-CM | POA: Insufficient documentation

## 2017-10-15 DIAGNOSIS — R079 Chest pain, unspecified: Secondary | ICD-10-CM | POA: Insufficient documentation

## 2017-10-15 LAB — BASIC METABOLIC PANEL
Anion gap: 11 (ref 5–15)
BUN: 12 mg/dL (ref 6–20)
CO2: 22 mmol/L (ref 22–32)
CREATININE: 0.56 mg/dL (ref 0.44–1.00)
Calcium: 9.3 mg/dL (ref 8.9–10.3)
Chloride: 106 mmol/L (ref 98–111)
GFR calc Af Amer: >60 mL/min (ref >60)
GLUCOSE: 95 mg/dL (ref 70–99)
Potassium: 3.5 mmol/L (ref 3.5–5.1)
SODIUM: 139 mmol/L (ref 135–145)

## 2017-10-15 LAB — CBC
HEMATOCRIT: 33.6 % — AB (ref 36.0–46.0)
Hemoglobin: 10.6 g/dL — ABNORMAL LOW (ref 12.0–15.0)
MCH: 27 pg (ref 26.0–34.0)
MCHC: 31.5 g/dL (ref 30.0–36.0)
MCV: 85.5 fL (ref 78.0–100.0)
PLATELETS: 419 10*3/uL — AB (ref 150–400)
RBC: 3.93 MIL/uL (ref 3.87–5.11)
RDW: 15 % (ref 11.5–15.5)
WBC: 9.1 10*3/uL (ref 4.0–10.5)

## 2017-10-15 LAB — D-DIMER, QUANTITATIVE: D-Dimer, Quant: 0.36 ug/mL-FEU (ref 0.00–0.50)

## 2017-10-15 LAB — I-STAT TROPONIN, ED: Troponin i, poc: 0 ng/mL (ref 0.00–0.08)

## 2017-10-15 LAB — I-STAT BETA HCG BLOOD, ED (MC, WL, AP ONLY): I-stat hCG, quantitative: <5 m[IU]/mL (ref <5)

## 2017-10-15 LAB — TSH: TSH: 1.636 u[IU]/mL (ref 0.350–4.500)

## 2017-10-15 MED ORDER — KETOROLAC TROMETHAMINE 15 MG/ML IJ SOLN
15.0000 mg | Freq: Once | INTRAMUSCULAR | Status: AC
  Administered 2017-10-15: 15 mg via INTRAVENOUS
  Filled 2017-10-15: qty 1

## 2017-10-15 MED ORDER — NAPROXEN 375 MG PO TABS: 375.0000 mg | ORAL_TABLET | Freq: Two times a day (BID) | ORAL | 0 refills | Status: DC

## 2017-10-15 NOTE — ED Triage Notes (Signed)
Patient here from home with complaints of heart palpitations and chest pain that started at 11am today. Nausea, no vomiting. Hx of SVT with syncopal episodes.

## 2017-10-15 NOTE — Discharge Instructions (Addendum)
You were seen in the ER today for abnormal heart beat sensation and chest pain.  Your work-up in the emergency department today was reassuring.  Your hemoglobin was slightly low at 10.6, however this is similar to previous hemoglobins you have had performed.  Your platelets were a little bit high.  These are both things that can be rechecked by primary care provider.  Otherwise your labs were all normal including the lab we check for a heart attack in the lab we check for a blood clot.  Your chest x-ray was normal.  We are unsure of the exact cause of your symptoms.  For this reason we would like you to follow-up with cardiology.  In the meantime we are sending you home with prescription for naproxen to help with discomfort. Naproxen is a nonsteroidal anti-inflammatory medication that will help with pain and swelling. Be sure to take this medication as prescribed with food, 1 pill every 12 hours,  It should be taken with food, as it can cause stomach upset, and more seriously, stomach bleeding. Do not take other nonsteroidal anti-inflammatory medications with this such as Advil, Motrin, or Aleve.   You may take Tylenol with this medication safely per over-the-counter dosing instructions.  Please follow-up with your cardiologist within 3 days for reevaluation.  Return to the ER anytime for new or worsening symptoms including but not limited to worsening discomfort, trouble breathing, passing out, or any other concerns.

## 2017-10-15 NOTE — ED Provider Notes (Signed)
[facility] DEPT Provider Note   CSN: [id number] Arrival date & time: 10/15/17  1753     History   Chief Complaint Chief Complaint  Patient presents with  . Chest Pain  . Palpitations    HPI Madison Copeland is a 32 y.o. female with a hx of anemia, migraines, prior tobacco use, and SVT who presents to the ED with complaints of palpitations that started at 11:00 this AM. Patient states she has had waxing/waning palpitations described as abnormal heart beat sensation or overly hard heartbeat as well chest pressure/heaviness (substernal/L chest). She states that she has also felt a bit short of breath at times and it hurts to take a deep breath. She reports she has also felt a bit anxious and nauseous at times, this worsens her sxs. No other specific alleviating/aggravating factors. Not exertional. She states she does have a hx of similar with previous SVT episodes which have resulted in syncope in the past, no syncope today. She was seen by cardiology and had an EP study with attempts of ablation, but this was unsuccessful as she did not have SVT during procedure.She reports she stopped taking Cardizem as she felt it was not helping, has not had in > 1 year, she stopped eating red meat which she thinks may have helped as she has not had repeat episode until today. Denies fever, chills, cough, or syncope. Denies leg pain/swelling, hemoptysis, recent surgery/trauma, recent long travel, hormone use, or personal hx of cancer. Patient states she think she may have had a blood clot when she was pregnant 11 years ago, but is unsure.     HPI  Past Medical History:  Diagnosis Date  . Anemia    during pregnancy  . Asthma   . 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 Fibroid, uterine    small intramural  . Migraine   . Ovarian cyst rupture 07/22/13  . Tobacco abuse    quit smoking 2015    Patient Active Problem List   Diagnosis Date Noted  . History of  prior ablation treatment 04/15/2016  . History of palpitations 04/15/2016  . SVT (supraventricular tachycardia) ([place]) 09/30/2015  . Syncope 09/30/2015  . Pulmonary infiltrate 07/25/2013  . Ovarian cyst rupture 07/23/2013  . Dysmenorrhea 10/03/2012    Past Surgical History:  Procedure Laterality Date  . CESAREAN SECTION     x 2  . ELECTROPHYSIOLOGIC STUDY N/A 10/27/2015   EP study normal, no inducible arrhythmias, ablation not performed  . TUBAL LIGATION       OB History    Gravida  3   Para      Term      Preterm      AB  1   Living  2     SAB  1   TAB      Ectopic      Multiple      Live Births               Home Medications    Prior to Admission medications   Medication Sig Start Date End Date Taking? Authorizing Provider  acetaminophen (TYLENOL) 500 MG tablet Take 2 tablets (1,000 mg total) by mouth every 6 (six) hours as needed. Do NOT exceed 3000 mg of acetaminophen from all sources in 24 hours. Patient not taking: Reported on 04/11/2016 01/21/16   Elveria Rising, MD  acetaminophen-codeine (TYLENOL #3) 300-30 MG tablet Take 1 tablet by mouth every 6 (six) hours as  needed for moderate pain.     [provider]  albuterol (PROVENTIL HFA;VENTOLIN HFA) 108 (90 Base) MCG/ACT inhaler Inhale 2 puffs into the lungs every 6 (six) hours as needed for wheezing or shortness of breath.    [provider]  butalbital-acetaminophen-caffeine (FIORICET/CODEINE) 50-325-40-30 MG capsule Take 1 capsule by mouth every 4 (four) hours as needed for headache. 04/11/16   Alfonse Spruce, FNP  cyclobenzaprine (FLEXERIL) 10 MG tablet Take 1 tablet (10 mg total) by mouth 3 (three) times daily as needed for muscle spasms. 04/11/16   Alfonse Spruce, FNP  diltiazem (CARDIZEM CD) 120 MG 24 hr capsule Take 1 capsule (120 mg total) by mouth daily. 09/18/15   Thompson Grayer, MD    Family History Family History  Problem Relation Age of Onset  . Diabetes Maternal  Grandmother   . Kidney disease Maternal Grandmother   . Heart disease Mother        mitral valve prolapse  . Cervical cancer Maternal Aunt     Social History Social History   Tobacco Use  . Smoking status: Former Smoker    Packs/day: 0.25    Years: 15.00    Pack years: 3.75    Types: Cigarettes    Last attempt to quit: 07/08/2013    Years since quitting: 4.2  . Smokeless tobacco: Never Used  Substance Use Topics  . Alcohol use: Yes    Comment: occasion  . Drug use: No     Allergies   Zofran [ondansetron hcl] and Adhesive [tape]   Review of Systems Review of Systems  Constitutional: Negative for chills and fever.  Respiratory: Positive for shortness of breath. Negative for cough.   Cardiovascular: Positive for chest pain and palpitations. Negative for leg swelling.  Gastrointestinal: Positive for nausea. Negative for abdominal pain, blood in stool, constipation, diarrhea and vomiting.  Genitourinary: Negative for dysuria and vaginal bleeding.  All other systems reviewed and are negative.    Physical Exam Updated Vital Signs BP 111/79 (BP Location: Left Arm)   Pulse 81   Temp 98.5 F (36.9 C) (Oral)   Resp 18   Wt 60.8 kg (134 lb)   SpO2 100%   BMI 24.51 kg/m   Physical Exam  Constitutional: She appears well-developed and well-nourished. No distress.  HENT:  Head: Normocephalic and atraumatic.  Eyes: Conjunctivae are normal. Right eye exhibits no discharge. Left eye exhibits no discharge.  Cardiovascular: Normal rate and regular rhythm. Exam reveals no friction rub.  No murmur heard. Pulses:      Radial pulses are 2+ on the right side, and 2+ on the left side.       Posterior tibial pulses are 2+ on the right side, and 2+ on the left side.  Pulmonary/Chest: Effort normal and breath sounds normal. No respiratory distress. She has no wheezes. She has no rales. She exhibits tenderness (left anterior chest wall). She exhibits no crepitus, no edema, no deformity,  no swelling and no retraction.  Abdominal: Soft. She exhibits no distension. There is no tenderness.  Musculoskeletal:       Right lower leg: She exhibits no tenderness and no edema.       Left lower leg: She exhibits no tenderness and no edema.  Neurological: She is alert.  Clear speech.   Skin: Skin is warm and dry. No rash noted.  Psychiatric: Her behavior is normal. Her mood appears anxious (mild).  Nursing note and vitals reviewed.    ED Treatments /  Results  Labs Results for orders placed or performed during the hospital encounter of 16/10/96  Basic metabolic panel  Result Value Ref Range   Sodium 139 135 - 145 mmol/L   Potassium 3.5 3.5 - 5.1 mmol/L   Chloride 106 98 - 111 mmol/L   CO2 22 22 - 32 mmol/L   Glucose, Bld 95 70 - 99 mg/dL   BUN 12 6 - 20 mg/dL   Creatinine, Ser 0.56 0.44 - 1.00 mg/dL   Calcium 9.3 8.9 - 10.3 mg/dL   GFR calc non Af Amer >60 >60 mL/min   GFR calc Af Amer >60 >60 mL/min   Anion gap 11 5 - 15  CBC  Result Value Ref Range   WBC 9.1 4.0 - 10.5 K/uL   RBC 3.93 3.87 - 5.11 MIL/uL   Hemoglobin 10.6 (L) 12.0 - 15.0 g/dL   HCT 33.6 (L) 36.0 - 46.0 %   MCV 85.5 78.0 - 100.0 fL   MCH 27.0 26.0 - 34.0 pg   MCHC 31.5 30.0 - 36.0 g/dL   RDW 15.0 11.5 - 15.5 %   Platelets 419 (H) 150 - 400 K/uL  D-dimer, quantitative (not at [facility])  Result Value Ref Range   D-Dimer, Quant 0.36 0.00 - 0.50 ug/mL-FEU  I-stat troponin, ED  Result Value Ref Range   Troponin i, poc 0.00 0.00 - 0.08 ng/mL   Comment 3          I-Stat beta hCG blood, ED  Result Value Ref Range   I-stat hCG, quantitative <5.0 <5 mIU/mL   Comment 3           EKG EKG Interpretation  Date/Time:  Sunday October 15 2017 18:15:23 EDT Ventricular Rate:  77 PR Interval:    QRS Duration: 95 QT Interval:  362 QTC Calculation: 410 R Axis:   82 Text Interpretation:  Sinus arrhythmia Ventricular premature complex Low voltage, precordial leads Abnormal Q suggests anterior infarct Borderline  repolarization abnormality Abnormal ekg Confirmed by Carmin Muskrat [phone]) on 10/15/2017 7:48:30 PM   Radiology Dg Chest 2 View  Result Date: 10/15/2017 CLINICAL DATA:  32 y/o  F; palpitations and chest pain. EXAM: CHEST - 2 VIEW COMPARISON:  01/21/2016 chest radiograph. FINDINGS: Stable heart size and mediastinal contours are within normal limits. Both lungs are clear. The visualized skeletal structures are unremarkable. IMPRESSION: Stable cardiac silhouette.  No acute pulmonary process. Electronically Signed   By: Kristine Garbe M.D.   On: 10/15/2017 19:55    Procedures Procedures (including critical care time)  Medications Ordered in ED Medications  ketorolac (TORADOL) 15 MG/ML injection 15 mg (has no administration in time range)     Initial Impression / Assessment and Plan / ED Course  I have reviewed the triage vital signs and the nursing notes.  Pertinent labs & imaging results that were available during my care of the patient were reviewed by me and considered in my medical decision making (see chart for details).   Patient presents to the ED with complaints of palpitations/chest pressure/dyspnea today. Patient nontoxic appearing, resting comfortably, initial vitals WNL, mild elevation in diastolic BP at times, doubt HTN emergency. Benign exam other than L sided chest wall tenderness. Work-up reviewed: mild anemia with hgb 10.6- fairly similar, slightly decreased from prior on record. Thombocytosis at 419- PCP recheck. No significant electrolyte abnormalities. Troponin and d-dimer WNL. TSH pending. CXR negative for infiltrate, effusion, pneumothorax, or bony abnormalities. No friction rub to raise concern for pericarditis. Patient's troponin  WNL after > 3 hours of persistent sxs, she lacks cardiac risk factors, doubt ACS. Patient low to moderate risk wells (unclear if she had previous VTE)- d-dimer negative, doubt pulmonary embolism. Patient has remained in NSR with  occasional PVCs in the ED, no evidence of SVT or other underlying arrhythmia a this time. No tachycardia or hypotension. Some reproducibility with chest wall palpation. Unclear definitive etiology however patient appears hemodynamically stable and safe for discharge home. Will trial NSAIDs with close cardiology follow up. I discussed results, treatment plan, need for follow-up, and return precautions with the patient. Provided opportunity for questions, patient confirmed understanding and is in agreement with plan.    Findings and plan of care discussed with supervising physician Dr. Vanita Panda who is in agreement with plan.   Final Clinical Impressions(s) / ED Diagnoses   Final diagnoses:  Chest pain, unspecified type  Palpitations    ED Discharge Orders        Ordered    naproxen (NAPROSYN) 375 MG tablet  2 times daily     10/15/17 2137       Amaryllis Dyke, PA-C 10/15/17 2248    Carmin Muskrat, MD 10/15/17 2354

## 2017-10-31 ENCOUNTER — Ambulatory Visit: Payer: Self-pay

## 2018-04-24 ENCOUNTER — Other Ambulatory Visit: Payer: Self-pay

## 2018-04-24 ENCOUNTER — Encounter (HOSPITAL_COMMUNITY): Payer: Self-pay

## 2018-04-24 ENCOUNTER — Emergency Department (HOSPITAL_COMMUNITY)
Admission: EM | Admit: 2018-04-24 | Discharge: 2018-04-24 | Disposition: A | Discharge status: Home / Self Care | Payer: Self-pay | Attending: Emergency Medicine | Admitting: Emergency Medicine

## 2018-04-24 DIAGNOSIS — Z87891 Personal history of nicotine dependence: Secondary | ICD-10-CM | POA: Insufficient documentation

## 2018-04-24 DIAGNOSIS — Z79899 Other long term (current) drug therapy: Secondary | ICD-10-CM | POA: Insufficient documentation

## 2018-04-24 DIAGNOSIS — J111 Influenza due to unidentified influenza virus with other respiratory manifestations: Secondary | ICD-10-CM | POA: Insufficient documentation

## 2018-04-24 DIAGNOSIS — J45909 Unspecified asthma, uncomplicated: Secondary | ICD-10-CM | POA: Insufficient documentation

## 2018-04-24 NOTE — ED Triage Notes (Signed)
Pt c/o flu-like symptoms, cough, congestion, body aches and occasional fever. Son Dx with flu last week.

## 2018-04-24 NOTE — Discharge Instructions (Addendum)
Continue plenty of fluids Take tylenol and ibuprofen as needed for muscle aches and fever Return if you are unable to keep down fluids or become short of breath

## 2018-04-24 NOTE — ED Provider Notes (Signed)
[facility] DEPT Provider Note   CSN: [id number] Arrival date & time: 04/24/18  4098     History   Chief Complaint Chief Complaint  Patient presents with  . Influenza    HPI Madison Copeland is a 33 y.o. female.  HPI 33 year old female presents today complaining of 4 days of influenza-like symptoms.  She has a son who was diagnosed with flu approximately a week ago.  She has had nasal congestion, cough, nausea but no vomiting.  She has had subjective fever.  She has been working until yesterday when she was sent home from work.  She denies headache, eye pain, chest pain, dyspnea, abdominal pain Past Medical History:  Diagnosis Date  . Anemia    during pregnancy  . Asthma   . 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 Fibroid, uterine    small intramural  . Migraine   . Ovarian cyst rupture 07/22/13  . Tobacco abuse    quit smoking 2015    Patient Active Problem List   Diagnosis Date Noted  . History of prior ablation treatment 04/15/2016  . History of palpitations 04/15/2016  . SVT (supraventricular tachycardia) ([place]) 09/30/2015  . Syncope 09/30/2015  . Pulmonary infiltrate 07/25/2013  . Ovarian cyst rupture 07/23/2013  . Dysmenorrhea 10/03/2012    Past Surgical History:  Procedure Laterality Date  . CESAREAN SECTION     x 2  . ELECTROPHYSIOLOGIC STUDY N/A 10/27/2015   EP study normal, no inducible arrhythmias, ablation not performed  . TUBAL LIGATION       OB History    Gravida  3   Para      Term      Preterm      AB  1   Living  2     SAB  1   TAB      Ectopic      Multiple      Live Births               Home Medications    Prior to Admission medications   Medication Sig Start Date End Date Taking? Authorizing Provider  albuterol (PROVENTIL HFA;VENTOLIN HFA) 108 (90 Base) MCG/ACT inhaler Inhale 2 puffs into the lungs every 6 (six) hours as needed for wheezing or shortness of breath.    [provider]  naproxen (NAPROSYN) 375 MG tablet Take 1 tablet (375 mg total) by mouth 2 (two) times daily. 10/15/17   Petrucelli, Glynda Jaeger, PA-C    Family History Family History  Problem Relation Age of Onset  . Diabetes Maternal Grandmother   . Kidney disease Maternal Grandmother   . Heart disease Mother        mitral valve prolapse  . Cervical cancer Maternal Aunt     Social History Social History   Tobacco Use  . Smoking status: Former Smoker    Packs/day: 0.25    Years: 15.00    Pack years: 3.75    Types: Cigarettes    Last attempt to quit: 07/08/2013    Years since quitting: 4.7  . Smokeless tobacco: Never Used  Substance Use Topics  . Alcohol use: Yes    Comment: occasion  . Drug use: No     Allergies   Ondansetron hcl and Tape   Review of Systems Review of Systems  All other systems reviewed and are negative.    Physical Exam Updated Vital Signs BP 134/85   Pulse 72   Temp 98.4 F (  36.9 C) (Oral)   Resp 18   SpO2 100%   Physical Exam Vitals signs and nursing note reviewed.  Constitutional:      Appearance: Normal appearance.  HENT:     Head: Normocephalic and atraumatic.     Right Ear: Tympanic membrane and external ear normal.     Left Ear: Tympanic membrane and external ear normal.     Nose: Nose normal.     Mouth/Throat:     Mouth: Mucous membranes are moist.     Pharynx: Oropharynx is clear.  Eyes:     Pupils: Pupils are equal, round, and reactive to light.  Neck:     Musculoskeletal: Normal range of motion.  Cardiovascular:     Rate and Rhythm: Normal rate and regular rhythm.  Pulmonary:     Effort: Pulmonary effort is normal.     Breath sounds: Normal breath sounds.  Abdominal:     General: Abdomen is flat. Bowel sounds are normal.     Palpations: Abdomen is soft.  Skin:    General: Skin is warm and dry.     Capillary Refill: Capillary refill takes less than 2 seconds.  Neurological:     General: No focal deficit present.       Mental Status: She is alert and oriented to person, place, and time.  Psychiatric:        Mood and Affect: Mood normal.        Behavior: Behavior normal.      ED Treatments / Results  Labs (all labs ordered are listed, but only abnormal results are displayed) Labs Reviewed - No data to display  EKG None  Radiology No results found.  Procedures Procedures (including critical care time)  Medications Ordered in ED Medications - No data to display   Initial Impression / Assessment and Plan / ED Course  I have reviewed the triage vital signs and the nursing notes.  Pertinent labs & imaging results that were available during my care of the patient were reviewed by me and considered in my medical decision making (see chart for details).     Final Clinical Impressions(s) / ED Diagnoses   Final diagnoses:  Influenza    ED Discharge Orders    None       Pattricia Boss, MD 04/24/18 [phone]

## 2018-11-25 ENCOUNTER — Emergency Department (HOSPITAL_COMMUNITY)
Admission: EM | Admit: 2018-11-25 | Discharge: 2018-11-25 | Disposition: A | Discharge status: Home / Self Care | Payer: No Typology Code available for payment source | Attending: Emergency Medicine | Admitting: Emergency Medicine

## 2018-11-25 ENCOUNTER — Emergency Department (HOSPITAL_COMMUNITY): Payer: No Typology Code available for payment source

## 2018-11-25 ENCOUNTER — Other Ambulatory Visit: Payer: Self-pay

## 2018-11-25 ENCOUNTER — Encounter (HOSPITAL_COMMUNITY): Payer: Self-pay | Admitting: Emergency Medicine

## 2018-11-25 DIAGNOSIS — J45909 Unspecified asthma, uncomplicated: Secondary | ICD-10-CM | POA: Insufficient documentation

## 2018-11-25 DIAGNOSIS — M7918 Myalgia, other site: Secondary | ICD-10-CM | POA: Insufficient documentation

## 2018-11-25 DIAGNOSIS — M542 Cervicalgia: Secondary | ICD-10-CM

## 2018-11-25 DIAGNOSIS — H538 Other visual disturbances: Secondary | ICD-10-CM | POA: Diagnosis not present

## 2018-11-25 DIAGNOSIS — Y92410 Unspecified street and highway as the place of occurrence of the external cause: Secondary | ICD-10-CM | POA: Diagnosis not present

## 2018-11-25 DIAGNOSIS — R110 Nausea: Secondary | ICD-10-CM | POA: Diagnosis not present

## 2018-11-25 DIAGNOSIS — Z87891 Personal history of nicotine dependence: Secondary | ICD-10-CM | POA: Diagnosis not present

## 2018-11-25 DIAGNOSIS — Y998 Other external cause status: Secondary | ICD-10-CM | POA: Insufficient documentation

## 2018-11-25 DIAGNOSIS — R42 Dizziness and giddiness: Secondary | ICD-10-CM | POA: Diagnosis not present

## 2018-11-25 DIAGNOSIS — S0990XA Unspecified injury of head, initial encounter: Secondary | ICD-10-CM | POA: Diagnosis present

## 2018-11-25 DIAGNOSIS — Y9389 Activity, other specified: Secondary | ICD-10-CM | POA: Insufficient documentation

## 2018-11-25 DIAGNOSIS — S060X0A Concussion without loss of consciousness, initial encounter: Secondary | ICD-10-CM | POA: Diagnosis not present

## 2018-11-25 MED ORDER — IBUPROFEN 400 MG PO TABS
600.0000 mg | ORAL_TABLET | Freq: Once | ORAL | Status: AC
  Administered 2018-11-25: 600 mg via ORAL
  Filled 2018-11-25: qty 1

## 2018-11-25 MED ORDER — METHOCARBAMOL 500 MG PO TABS: 500.0000 mg | ORAL_TABLET | Freq: Two times a day (BID) | ORAL | 0 refills | Status: AC

## 2018-11-25 NOTE — Discharge Instructions (Signed)
The pain you are experiencing is likely due to muscle strain, you may take Ibuprofen and Robaxin as needed for pain management. Do not combine with any pain reliever other than tylenol.  You may also use ice and heat, and over-the-counter remedies such as Biofreeze gel or salon pas lidocaine patches. The muscle soreness should improve over the next week. Follow up with your family doctor in the next week for a recheck if you are still having symptoms. Return to ED if pain is worsening, you develop weakness or numbness of extremities, or new or concerning symptoms develop.  You were examined today for a head injury and concussion.  Sometimes serious problems can develop after a head injury. Please return to the emergency department if you experience any of the following symptoms: Repeated vomiting Headache that gets worse and does not go away Loss of consciousness or inability to stay awake at times when you   normally would be able to Getting more confused, restless or agitated Convulsions or seizures Difficulty walking or feeling off balance Weakness or numbness Vision changes  A concussion is a very mild traumatic brain injury caused by a bump, jolt or blow to the head, most people recover quickly and fully. You can experience a wide variety of symptoms including:   - Confusion      - Difficulty concentrating       - Trouble remembering new info  - Headache      - Dizziness        - Fuzzy or blurry vision  - Fatigue      - Balance problems      - Light sensitivity  - Mood swings     - Changes in sleep or difficulty sleeping   To help these symptoms improve make sure you are getting plenty of rest, avoid screen time, loud music and strenuous mental activities. Avoid any strenuous physical activities, once your symptoms have resolved a slow and gradual return to activity is recommended. It is very important that you avoid situations in which you might sustain a second head injury as this can be  very dangerous and life threatening. You cannot be medically cleared to return to normal activities until you have followed up with your primary doctor or a concussion specialist for reevaluation.
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
## 2018-11-25 NOTE — ED Notes (Signed)
Patient transported to CT 

## 2018-12-17 ENCOUNTER — Other Ambulatory Visit: Payer: Self-pay

## 2018-12-17 ENCOUNTER — Ambulatory Visit: Payer: Self-pay | Attending: Family Medicine | Admitting: Family Medicine

## 2018-12-17 DIAGNOSIS — M542 Cervicalgia: Secondary | ICD-10-CM

## 2018-12-17 DIAGNOSIS — S060X0A Concussion without loss of consciousness, initial encounter: Secondary | ICD-10-CM

## 2018-12-17 MED ORDER — MECLIZINE HCL 25 MG PO TABS: 25.0000 mg | ORAL_TABLET | Freq: Every day | ORAL | 1 refills | Status: AC
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
## 2018-12-17 NOTE — Progress Notes (Signed)
Patient has been called and DOB has been verified. Patient has been screened and transferred to PCP to start phone visit.    Patient is having pain in the back and side of head.  Patient is also having neck pain.

## 2019-03-08 ENCOUNTER — Other Ambulatory Visit: Payer: Self-pay

## 2019-03-08 ENCOUNTER — Emergency Department (HOSPITAL_COMMUNITY): Payer: Self-pay

## 2019-03-08 ENCOUNTER — Emergency Department (HOSPITAL_COMMUNITY)
Admission: EM | Admit: 2019-03-08 | Discharge: 2019-03-08 | Disposition: A | Discharge status: Home / Self Care | Payer: Self-pay | Attending: Emergency Medicine | Admitting: Emergency Medicine

## 2019-03-08 DIAGNOSIS — J45909 Unspecified asthma, uncomplicated: Secondary | ICD-10-CM | POA: Insufficient documentation

## 2019-03-08 DIAGNOSIS — U071 COVID-19: Secondary | ICD-10-CM | POA: Insufficient documentation

## 2019-03-08 DIAGNOSIS — Z87891 Personal history of nicotine dependence: Secondary | ICD-10-CM | POA: Insufficient documentation

## 2019-03-08 MED ORDER — ALBUTEROL SULFATE HFA 108 (90 BASE) MCG/ACT IN AERS
2.0000 | INHALATION_SPRAY | RESPIRATORY_TRACT | Status: DC | PRN
  Administered 2019-03-08: 2 via RESPIRATORY_TRACT
  Filled 2019-03-08: qty 6.7

## 2019-03-08 NOTE — ED Notes (Signed)
X-ray at bedside

## 2019-03-08 NOTE — ED Provider Notes (Signed)
[facility] DEPT Provider Note   CSN: [id number] Arrival date & time: 03/08/19  1545     History   Chief Complaint Chief Complaint  Patient presents with  . COVID POS  . Chest Pain    HPI Madison Copeland is a 33 y.o. female.     Patient is a 33 year old female with a history of pneumonia, asthma and SVT who is presenting today with chest pain and shortness of breath.  Patient tested Covid +2 days ago but has now had symptoms for approximately 8 days.  Symptoms started last Friday with generalized body aches and mild sore throat.  It then progressed to myalgias, loss of taste and smell and then today when she woke up she felt tightness in her chest and shortness of breath with activity.  She states that shortness of breath is mild and at rest she does not notice it at all.  She was having fever earlier on but that has resolved.  She had not noticed any wheezing and denies tobacco or drug use.  She does not have an inhaler at home and has not tried that today.  She denies any leg pain or swelling.  No abdominal pain nausea or vomiting.  Currently takes no medications   Chest Pain   Past Medical History:  Diagnosis Date  . Anemia    during pregnancy  . Asthma   . 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 Fibroid, uterine    small intramural  . Migraine   . Ovarian cyst rupture 07/22/13  . Tobacco abuse    quit smoking 2015    Patient Active Problem List   Diagnosis Date Noted  . History of prior ablation treatment 04/15/2016  . History of palpitations 04/15/2016  . SVT (supraventricular tachycardia) ([place]) 09/30/2015  . Syncope 09/30/2015  . Pulmonary infiltrate 07/25/2013  . Ovarian cyst rupture 07/23/2013  . Dysmenorrhea 10/03/2012    Past Surgical History:  Procedure Laterality Date  . CESAREAN SECTION     x 2  . ELECTROPHYSIOLOGIC STUDY N/A 10/27/2015   EP study normal, no inducible arrhythmias, ablation not performed  . TUBAL  LIGATION       OB History    Gravida  3   Para      Term      Preterm      AB  1   Living  2     SAB  1   TAB      Ectopic      Multiple      Live Births               Home Medications    Prior to Admission medications   Medication Sig Start Date End Date Taking? Authorizing Provider  albuterol (PROVENTIL HFA;VENTOLIN HFA) 108 (90 Base) MCG/ACT inhaler Inhale 2 puffs into the lungs every 6 (six) hours as needed for wheezing or shortness of breath.    [provider]  meclizine (ANTIVERT) 25 MG tablet Take 1 tablet (25 mg total) by mouth at bedtime. 12/17/18   Charlott Rakes, MD  methocarbamol (ROBAXIN) 500 MG tablet Take 1 tablet (500 mg total) by mouth 2 (two) times daily. 11/25/18   Jacqlyn Larsen, PA-C  naproxen (NAPROSYN) 375 MG tablet Take 1 tablet (375 mg total) by mouth 2 (two) times daily. Patient not taking: Reported on 12/17/2018 10/15/17   Petrucelli, Glynda Jaeger, PA-C    Family History Family History  Problem Relation Age of  Onset  . Diabetes Maternal Grandmother   . Kidney disease Maternal Grandmother   . Heart disease Mother        mitral valve prolapse  . Cervical cancer Maternal Aunt     Social History Social History   Tobacco Use  . Smoking status: Former Smoker    Packs/day: 0.25    Years: 15.00    Pack years: 3.75    Types: Cigarettes    Quit date: 07/08/2013    Years since quitting: 5.6  . Smokeless tobacco: Never Used  Substance Use Topics  . Alcohol use: Yes    Comment: occasion  . Drug use: No     Allergies   Ondansetron hcl and Tape   Review of Systems Review of Systems  Cardiovascular: Positive for chest pain.  All other systems reviewed and are negative.    Physical Exam Updated Vital Signs BP (!) 160/95   Pulse 73   Temp 98.5 F (36.9 C) (Oral)   Resp 18   Ht 5\' 2"  (1.575 m)   Wt 61.2 kg   LMP 02/06/2019 (LMP Unknown)   SpO2 100%   BMI 24.69 kg/m   Physical Exam Vitals signs and nursing  note reviewed.  Constitutional:      General: She is not in acute distress.    Appearance: She is well-developed and normal weight.  HENT:     Head: Normocephalic and atraumatic.  Eyes:     Pupils: Pupils are equal, round, and reactive to light.  Cardiovascular:     Rate and Rhythm: Normal rate and regular rhythm.     Pulses: Normal pulses.     Heart sounds: Normal heart sounds. No murmur. No friction rub.  Pulmonary:     Effort: Pulmonary effort is normal.     Breath sounds: Normal breath sounds. No wheezing or rales.  Abdominal:     General: Bowel sounds are normal. There is no distension.     Palpations: Abdomen is soft.     Tenderness: There is no abdominal tenderness. There is no guarding or rebound.  Musculoskeletal: Normal range of motion.        General: No tenderness.     Right lower leg: No edema.     Left lower leg: No edema.     Comments: No edema  Skin:    General: Skin is warm and dry.     Findings: No rash.  Neurological:     General: No focal deficit present.     Mental Status: She is alert and oriented to person, place, and time. Mental status is at baseline.     Cranial Nerves: No cranial nerve deficit.  Psychiatric:        Mood and Affect: Mood normal.        Behavior: Behavior normal.        Thought Content: Thought content normal.      ED Treatments / Results  Labs (all labs ordered are listed, but only abnormal results are displayed) Labs Reviewed - No data to display  EKG EKG Interpretation  Date/Time:  Friday March 08 2019 16:56:27 EST Ventricular Rate:  71 PR Interval:    QRS Duration: 87 QT Interval:  403 QTC Calculation: 438 R Axis:   67 Text Interpretation: Sinus rhythm Consider left atrial enlargement Borderline repolarization abnormality Baseline wander in lead(s) I III aVL No significant change since last tracing Confirmed by Blanchie Dessert [phone]) on 03/08/2019 5:07:59 PM   Radiology Dg Chest Port 1  View  Result Date:  03/08/2019 CLINICAL DATA:  Shortness of breath EXAM: PORTABLE CHEST 1 VIEW COMPARISON:  11/25/2018 FINDINGS: The heart size and mediastinal contours are within normal limits. Both lungs are clear. The visualized skeletal structures are unremarkable. IMPRESSION: No active disease. Electronically Signed   By: Donavan Foil M.D.   On: 03/08/2019 18:28    Procedures Procedures (including critical care time)  Medications Ordered in ED Medications  albuterol (VENTOLIN HFA) 108 (90 Base) MCG/ACT inhaler 2 puff (has no administration in time range)     Initial Impression / Assessment and Plan / ED Course  I have reviewed the triage vital signs and the nursing notes.  Pertinent labs & imaging results that were available during my care of the patient were reviewed by me and considered in my medical decision making (see chart for details).        33 year old female with known Covid for the last 8 days now complaining of chest pain and shortness of breath.  Patient is in no acute distress on exam and is breathing comfortably.  Oxygen saturation 100% and respiratory rate 14.  Patient does have a history of asthma as well as pneumonia that has no significant lung findings.  She has no wheezing.  EKG is unchanged from priors.  Low suspicion for PE at this time.  No evidence for heart failure or ACS.  Patient given albuterol inhaler and chest x-ray is pending.  6:44 PM CXR wnl.  Continues to have good sats, rr and Hr.  Will d/c home with return precautions.  Charnique Ditsworth was evaluated in Emergency Department on 03/08/2019 for the symptoms described in the history of present illness. She was evaluated in the context of the global COVID-19 pandemic, which necessitated consideration that the patient might be at risk for infection with the SARS-CoV-2 virus that causes COVID-19. Institutional protocols and algorithms that pertain to the evaluation of patients at risk for COVID-19 are in a state of rapid change  based on information released by regulatory bodies including the CDC and federal and state organizations. These policies and algorithms were followed during the patient's care in the ED.   Final Clinical Impressions(s) / ED Diagnoses   Final diagnoses:  [id number]    ED Discharge Orders    None       Blanchie Dessert, MD 03/08/19 1845

## 2019-03-08 NOTE — ED Triage Notes (Addendum)
Pt arrived POV from home CC chest painX1 day secondary to [place]. Pt tested positive Wednesday 12/2  Hx Asthma

## 2019-03-08 NOTE — ED Notes (Signed)
Pt verbalizes understanding of DC instructions. Pt belongings returned and is ambulatory out of ED.  

## 2019-08-22 ENCOUNTER — Emergency Department (HOSPITAL_COMMUNITY)
Admission: EM | Admit: 2019-08-22 | Discharge: 2019-08-22 | Disposition: A | Discharge status: Home / Self Care | Payer: Self-pay | Attending: Emergency Medicine | Admitting: Emergency Medicine

## 2019-08-22 ENCOUNTER — Other Ambulatory Visit: Payer: Self-pay

## 2019-08-22 ENCOUNTER — Encounter (HOSPITAL_COMMUNITY): Payer: Self-pay | Admitting: Student

## 2019-08-22 ENCOUNTER — Emergency Department (HOSPITAL_COMMUNITY): Payer: Self-pay

## 2019-08-22 DIAGNOSIS — Z87891 Personal history of nicotine dependence: Secondary | ICD-10-CM | POA: Insufficient documentation

## 2019-08-22 DIAGNOSIS — Z79899 Other long term (current) drug therapy: Secondary | ICD-10-CM | POA: Insufficient documentation

## 2019-08-22 DIAGNOSIS — S62640A Nondisplaced fracture of proximal phalanx of right index finger, initial encounter for closed fracture: Secondary | ICD-10-CM | POA: Insufficient documentation

## 2019-08-22 DIAGNOSIS — Y93I9 Activity, other involving external motion: Secondary | ICD-10-CM | POA: Diagnosis not present

## 2019-08-22 DIAGNOSIS — J45909 Unspecified asthma, uncomplicated: Secondary | ICD-10-CM | POA: Diagnosis not present

## 2019-08-22 DIAGNOSIS — G44319 Acute post-traumatic headache, not intractable: Secondary | ICD-10-CM | POA: Diagnosis not present

## 2019-08-22 DIAGNOSIS — S161XXA Strain of muscle, fascia and tendon at neck level, initial encounter: Secondary | ICD-10-CM | POA: Diagnosis not present

## 2019-08-22 DIAGNOSIS — Y92410 Unspecified street and highway as the place of occurrence of the external cause: Secondary | ICD-10-CM | POA: Diagnosis not present

## 2019-08-22 DIAGNOSIS — Y999 Unspecified external cause status: Secondary | ICD-10-CM | POA: Insufficient documentation

## 2019-08-22 DIAGNOSIS — S6991XA Unspecified injury of right wrist, hand and finger(s), initial encounter: Secondary | ICD-10-CM | POA: Diagnosis present

## 2019-08-22 LAB — POC URINE PREG, ED: Preg Test, Ur: NEGATIVE

## 2019-08-22 MED ORDER — HYDROCODONE-ACETAMINOPHEN 5-325 MG PO TABS
1.0000 | ORAL_TABLET | Freq: Once | ORAL | Status: AC
  Administered 2019-08-22: 1 via ORAL
  Filled 2019-08-22: qty 1

## 2019-08-22 MED ORDER — LIDOCAINE HCL (PF) 1 % IJ SOLN
10.0000 mL | Freq: Once | INTRAMUSCULAR | Status: DC
  Filled 2019-08-22: qty 30

## 2019-08-22 MED ORDER — PROMETHAZINE HCL 25 MG PO TABS
25.0000 mg | ORAL_TABLET | Freq: Four times a day (QID) | ORAL | 0 refills | Status: DC | PRN
Start: 2019-08-22 — End: 2019-08-27

## 2019-08-22 MED ORDER — PROMETHAZINE HCL 25 MG PO TABS
25.0000 mg | ORAL_TABLET | Freq: Once | ORAL | Status: AC
  Administered 2019-08-22: 25 mg via ORAL
  Filled 2019-08-22: qty 1

## 2019-08-22 MED ORDER — HYDROCODONE-ACETAMINOPHEN 5-325 MG PO TABS: 2.0000 | ORAL_TABLET | ORAL | 0 refills | Status: AC | PRN

## 2019-08-22 MED ORDER — KETOROLAC TROMETHAMINE 30 MG/ML IJ SOLN
30.0000 mg | Freq: Once | INTRAMUSCULAR | Status: AC
  Administered 2019-08-22: 30 mg via INTRAMUSCULAR
  Filled 2019-08-22: qty 1
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
## 2019-08-22 NOTE — Discharge Instructions (Signed)
Follow-up with hand surgery as you might need surgery to fix your finger.  Written you for a short course of pain medicine.  Take as needed.  Make sure to ice and elevate your hand.  Return for any worsening symptoms.

## 2019-08-27 ENCOUNTER — Encounter (HOSPITAL_COMMUNITY): Payer: Self-pay

## 2019-08-27 ENCOUNTER — Other Ambulatory Visit: Payer: Self-pay

## 2019-08-27 ENCOUNTER — Ambulatory Visit (HOSPITAL_COMMUNITY)
Admission: EM | Admit: 2019-08-27 | Discharge: 2019-08-27 | Disposition: A | Discharge status: Home / Self Care | Payer: Self-pay | Attending: Emergency Medicine | Admitting: Emergency Medicine

## 2019-08-27 DIAGNOSIS — S060X0A Concussion without loss of consciousness, initial encounter: Secondary | ICD-10-CM

## 2019-08-27 MED ORDER — NAPROXEN 500 MG PO TABS: 500.0000 mg | ORAL_TABLET | Freq: Two times a day (BID) | ORAL | 0 refills | Status: AC | PRN

## 2019-08-27 MED ORDER — PROMETHAZINE HCL 25 MG PO TABS: 25.0000 mg | ORAL_TABLET | Freq: Four times a day (QID) | ORAL | 0 refills | Status: AC | PRN

## 2019-08-27 NOTE — Discharge Instructions (Signed)
Symptoms highly suggestive of concussion Please set up follow-up with sports medicine-contacts below Please use Naprosyn twice daily as needed for headache May use Phenergan for nausea Avoid screens, mental rest  Please follow-up in the emergency room if any symptoms worsening/intensify
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
## 2019-09-04 ENCOUNTER — Other Ambulatory Visit: Payer: Self-pay

## 2019-09-04 ENCOUNTER — Encounter: Payer: Self-pay | Admitting: Sports Medicine

## 2019-09-04 ENCOUNTER — Ambulatory Visit (INDEPENDENT_AMBULATORY_CARE_PROVIDER_SITE_OTHER): Payer: Self-pay | Admitting: Sports Medicine

## 2019-09-04 VITALS — BP 110/84 | Ht 62.0 in | Wt 150.0 lb

## 2019-09-04 DIAGNOSIS — S060X0A Concussion without loss of consciousness, initial encounter: Secondary | ICD-10-CM

## 2019-09-04 MED ORDER — AMITRIPTYLINE HCL 25 MG PO TABS: 25.0000 mg | ORAL_TABLET | Freq: Every day | ORAL | 0 refills | Status: AC

## 2019-09-04 NOTE — Patient Instructions (Signed)
You have a concussion Please limit studying to 20-30-minute increments, if you are having a headache while studying, please stop and try again at a later time Please start the amitriptyline prescription that was given to you to take once a day at bedtime Please do the home stretches that were given to you once a day Please make sure you are eating healthy balanced meals with protein Please stay out of work until you see me in 2 weeks on June 16 Do not hesitate to call or send me a message if you have questions in the interim
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
## 2019-09-18 ENCOUNTER — Encounter: Payer: Self-pay | Admitting: Sports Medicine

## 2019-09-18 ENCOUNTER — Other Ambulatory Visit: Payer: Self-pay

## 2019-09-18 ENCOUNTER — Ambulatory Visit (INDEPENDENT_AMBULATORY_CARE_PROVIDER_SITE_OTHER): Payer: No Typology Code available for payment source | Admitting: Sports Medicine

## 2019-09-18 VITALS — BP 124/86 | Ht 62.0 in | Wt 147.0 lb

## 2019-09-18 DIAGNOSIS — S060X0A Concussion without loss of consciousness, initial encounter: Secondary | ICD-10-CM | POA: Diagnosis not present

## 2019-09-18 NOTE — Progress Notes (Addendum)
[facility] [address] [place], [place] [address] Phone: [phone] Fax: [phone]   Patient Name: Madison Copeland Date of Birth: 04-07-1985 Medical Record Number: [id number] Gender: female Date of Encounter: 09/18/2019  SUBJECTIVE:      Chief Complaint:  Concussion follow-up  Date of injury: 08/22/2019   HPI:  Ms. Hopfer is following up for concussion management.  I last saw her 2 weeks ago at which time we started amitriptyline 25 mg at night, but did not start the medication and held her out of work.  She is using St. John's wort.  Overall, she feels she has improved since her last visit, but she is still having sensitivity to light.  She describes being able to stare at her computer screen for a few hours, but when she closes the screen, her headache and dizziness is severe enough that she cannot do anything else the rest of the day.  She has started seeing a chiropractor to help with the neck pain.  She denies any numbness or tingling into her fingertips.  Over the last 48 hours, she has noticed an increase in her nausea without vomiting.  She has promethazine at home but she has not taken any.     ROS:     See HPI.   PERTINENT  PMH / PSH / FH / SH:  Past Medical, Surgical, Social, and Family History Reviewed & Updated in the EMR.   OBJECTIVE:  BP 124/86   Ht 5\' 2"  (1.575 m)   Wt 147 lb (66.7 kg)   BMI 26.89 kg/m  Physical Exam:  Vital signs are reviewed.   GEN: Alert and oriented, NAD Pulm: Breathing unlabored PSY: normal mood, congruent affect  MSK: Neck No gross deformity, swelling, bruising. Bilateral para spinal spasm with tenderness along bilateral shoulder girdle. ROM of the neck is full but stiff BUE strength 5/5.   Sensation intact to light touch.   NV intact distal BUEs.  Concussion Self-Reported Symptom Score Symptoms rated on a scale 1-6, in last 24 hours Headache: 3 Pressure in head: 3 Neck pain: 4 Nausea:  5 Dizziness: 3 Blurred vision: 2 Balance problems: 2 Sensitivity to light: 4 Sensitivity noise: 3 Feeling slowed down: 3 Feel like in a fog: 2 Do not feel right: 3 Difficulty concentrating: 3 Difficulty remembering: 3 Fatigue or low energy: 4 Confusion: 2 Drowsiness: 4 More emotional: 2 Irritability: 3 Sadness: 2 Nervous or anxious: 3 Trouble falling asleep: 2  Symptoms worse with physical and mental activity  Total Symptom Score: 65 Previous Symptom Score: 73  Neuro CN II-XII intact.  There is still some dizziness with horizontal pursuit Dizziness with VOMS testing Uneven balance with closed eyes  ASSESSMENT & PLAN:   1. Postconcussion syndrome  We will trial melatonin at night to help increase to sleep so there is less napping during the day.  He does not need to start amitriptyline as her mood has improved.  I did encourage her to try to start using physical activity as a means to help with the fatigue.  I reiterated the fact that since this is her second concussion in less than a year, symptoms are going to linger much longer than normal.  I have given her a prescription for formal vestibular rehab to help ease her symptoms.  She will try using the screen just at night, so when she is done she can go directly to sleep.  She can use the promethazine as needed.  She  will follow-up in 3-4 weeks for reassessment.   Lanier Clam, DO, ATC Sports Medicine Fellow  Addendum:  I was the preceptor for this visit and available for immediate consultation.  Karlton Lemon MD Kirt Boys

## 2019-09-18 NOTE — Patient Instructions (Signed)
Follow-up in 3-4 weeks.

## 2019-10-09 ENCOUNTER — Encounter: Payer: No Typology Code available for payment source | Admitting: Physical Therapy

## 2019-10-31 ENCOUNTER — Other Ambulatory Visit: Payer: Self-pay

## 2019-10-31 ENCOUNTER — Ambulatory Visit: Payer: No Typology Code available for payment source | Attending: Family Medicine | Admitting: Physical Therapy

## 2019-10-31 DIAGNOSIS — G44321 Chronic post-traumatic headache, intractable: Secondary | ICD-10-CM | POA: Diagnosis present

## 2019-10-31 DIAGNOSIS — M542 Cervicalgia: Secondary | ICD-10-CM | POA: Insufficient documentation

## 2019-10-31 DIAGNOSIS — R42 Dizziness and giddiness: Secondary | ICD-10-CM | POA: Diagnosis not present

## 2019-10-31 DIAGNOSIS — R2681 Unsteadiness on feet: Secondary | ICD-10-CM
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
## 2019-11-07 ENCOUNTER — Encounter: Payer: Self-pay | Admitting: Physical Therapy

## 2019-11-07 ENCOUNTER — Other Ambulatory Visit: Payer: Self-pay

## 2019-11-07 ENCOUNTER — Ambulatory Visit: Payer: PRIVATE HEALTH INSURANCE | Attending: Family Medicine | Admitting: Physical Therapy

## 2019-11-07 DIAGNOSIS — G44321 Chronic post-traumatic headache, intractable: Secondary | ICD-10-CM | POA: Diagnosis present

## 2019-11-07 DIAGNOSIS — R42 Dizziness and giddiness: Secondary | ICD-10-CM

## 2019-11-07 DIAGNOSIS — R2681 Unsteadiness on feet: Secondary | ICD-10-CM | POA: Diagnosis present

## 2019-11-07 DIAGNOSIS — M542 Cervicalgia: Secondary | ICD-10-CM | POA: Insufficient documentation
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
## 2019-11-20 ENCOUNTER — Ambulatory Visit (INDEPENDENT_AMBULATORY_CARE_PROVIDER_SITE_OTHER): Payer: Self-pay | Admitting: Family Medicine

## 2019-11-20 ENCOUNTER — Ambulatory Visit: Payer: PRIVATE HEALTH INSURANCE | Admitting: Physical Therapy

## 2019-11-20 ENCOUNTER — Encounter: Payer: Self-pay | Admitting: Family Medicine

## 2019-11-20 ENCOUNTER — Encounter: Payer: Self-pay | Admitting: Physical Therapy

## 2019-11-20 ENCOUNTER — Other Ambulatory Visit: Payer: Self-pay

## 2019-11-20 VITALS — BP 122/82 | Ht 62.0 in | Wt 150.0 lb

## 2019-11-20 DIAGNOSIS — G44321 Chronic post-traumatic headache, intractable: Secondary | ICD-10-CM

## 2019-11-20 DIAGNOSIS — M542 Cervicalgia: Secondary | ICD-10-CM

## 2019-11-20 DIAGNOSIS — S060X0D Concussion without loss of consciousness, subsequent encounter: Secondary | ICD-10-CM

## 2019-11-20 DIAGNOSIS — R42 Dizziness and giddiness: Secondary | ICD-10-CM | POA: Diagnosis not present

## 2019-11-20 DIAGNOSIS — R2681 Unsteadiness on feet: Secondary | ICD-10-CM

## 2019-11-20 NOTE — Patient Instructions (Signed)
Gaze Stabilization: Sitting    Keeping eyes on target on wall 3 feet away, and move head side to side for 5 repetitions, slowly. Repeat while moving head up and down for 5 repetitions. Do __2-3__ sessions per day.  Copyright  VHI. All rights reserved.   Gaze Stabilization: Tip Card  1.Target must remain in focus, not blurry, and appear stationary while head is in motion. 2.Perform exercises with small head movements (45 to either side of midline). 3.Increase speed of head motion so long as target is in focus. 4.If you wear eyeglasses, be sure you can see target through lens (therapist will give specific instructions for bifocal / progressive lenses). 5.These exercises may provoke dizziness or nausea. Work through these symptoms. If too dizzy, slow head movement slightly. Rest between each exercise. 6.Exercises demand concentration; avoid distractions.  Copyright  VHI. All rights reserved.

## 2019-11-20 NOTE — Patient Instructions (Signed)
Continue vestibular therapy. We will refer you to an audiologist to assess your hearing and a neuroopthalmologist to ensure no concerning issues with your vision outside your postconcussion syndrome. Follow up with me in 1 month for reevaluation.
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
## 2019-11-22 ENCOUNTER — Other Ambulatory Visit: Payer: Self-pay

## 2019-11-22 DIAGNOSIS — S060X0D Concussion without loss of consciousness, subsequent encounter: Secondary | ICD-10-CM

## 2019-11-27 ENCOUNTER — Ambulatory Visit: Payer: PRIVATE HEALTH INSURANCE | Admitting: Physical Therapy

## 2019-12-05 ENCOUNTER — Ambulatory Visit: Payer: No Typology Code available for payment source | Attending: Audiologist | Admitting: Audiologist

## 2019-12-05 ENCOUNTER — Other Ambulatory Visit: Payer: Self-pay

## 2019-12-05 DIAGNOSIS — M542 Cervicalgia: Secondary | ICD-10-CM | POA: Diagnosis present

## 2019-12-05 DIAGNOSIS — G44321 Chronic post-traumatic headache, intractable: Secondary | ICD-10-CM | POA: Diagnosis present

## 2019-12-05 DIAGNOSIS — H93233 Hyperacusis, bilateral: Secondary | ICD-10-CM

## 2019-12-05 DIAGNOSIS — R42 Dizziness and giddiness: Secondary | ICD-10-CM | POA: Insufficient documentation

## 2019-12-05 DIAGNOSIS — R2681 Unsteadiness on feet: Secondary | ICD-10-CM | POA: Insufficient documentation
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
## 2019-12-06 ENCOUNTER — Ambulatory Visit: Payer: No Typology Code available for payment source | Admitting: Physical Therapy

## 2019-12-11 ENCOUNTER — Ambulatory Visit: Payer: No Typology Code available for payment source | Admitting: Physical Therapy

## 2019-12-11 ENCOUNTER — Encounter: Payer: Self-pay | Admitting: Physical Therapy

## 2019-12-11 ENCOUNTER — Other Ambulatory Visit: Payer: Self-pay

## 2019-12-11 DIAGNOSIS — M542 Cervicalgia: Secondary | ICD-10-CM

## 2019-12-11 DIAGNOSIS — R42 Dizziness and giddiness: Secondary | ICD-10-CM

## 2019-12-11 DIAGNOSIS — H93233 Hyperacusis, bilateral: Secondary | ICD-10-CM | POA: Diagnosis not present

## 2019-12-11 DIAGNOSIS — G44321 Chronic post-traumatic headache, intractable: Secondary | ICD-10-CM

## 2019-12-11 DIAGNOSIS — R2681 Unsteadiness on feet: Secondary | ICD-10-CM

## 2019-12-11 NOTE — Patient Instructions (Addendum)
Gaze Stabilization: Sitting    Keeping eyes on target on wall 3 feet away, and move head side to side for 5 repetitions, slowly. Repeat while moving head up and down for 5 repetitions. Do __2-3__ sessions per day.   Access Code: [email or web address] URL: [email or web address] Date: 12/11/2019 Prepared by: Misty Stanley  Exercises Walking with Head Rotation - 1 x daily - 7 x weekly - 4 sets Standing Weight Shift Side to Side - EYES CLOSED - 1 x daily - 7 x weekly - 4 sets - 10 second hold
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
## 2019-12-18 ENCOUNTER — Encounter: Payer: Self-pay | Admitting: Physical Therapy

## 2019-12-18 ENCOUNTER — Ambulatory Visit: Payer: No Typology Code available for payment source | Admitting: Physical Therapy

## 2019-12-18 NOTE — Therapy (Signed)
[facility] [address] [place], [place], [address] Phone: [phone]   Fax:  [phone]  Patient Details  Name: Mahlia Fernando MRN: [id number] Date of Birth: 1985-06-16 Referring Provider:  No ref. provider found  Encounter Date: 12/18/2019 PHYSICAL THERAPY DISCHARGE SUMMARY  Visits from Start of Care: 4  Current functional level related to goals / functional outcomes: Unable to formally assess.  Following last appointment pt contacted clinic and requested to cancel remaining visits.  Reports that the side effects after a treatment session are causing her to not function well.    Remaining deficits: HA, dizziness, visual disturbances   Education / Equipment: HEP  Plan: Patient agrees to discharge.  Patient goals were not met. Patient is being discharged due to the patient's request.  ?????     Rico Junker, PT, DPT 12/18/19    10:07 AM    [facility] [address] [place], [place], [address] Phone: [phone]   Fax:  [phone]

## 2019-12-25 ENCOUNTER — Encounter: Payer: No Typology Code available for payment source | Admitting: Physical Therapy

## 2020-01-01 ENCOUNTER — Encounter: Payer: No Typology Code available for payment source | Admitting: Physical Therapy
